# Patient Record
Sex: Female | Born: 2000 | Hispanic: Yes | Marital: Single | Smoking: Never smoker
Health system: Southern US, Community
[De-identification: ages and names within clinical notes are randomized; demographics above are authoritative.]

## PROBLEM LIST (undated history)

## (undated) DIAGNOSIS — Z789 Other specified health status: Secondary | ICD-10-CM

## (undated) HISTORY — PX: NO PAST SURGERIES: SHX2092

---
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
## 2019-03-11 ENCOUNTER — Emergency Department: Payer: Self-pay

## 2019-03-11 ENCOUNTER — Other Ambulatory Visit: Payer: Self-pay

## 2019-03-11 ENCOUNTER — Emergency Department
Admission: EM | Admit: 2019-03-11 | Discharge: 2019-03-11 | Disposition: A | Discharge status: Home / Self Care | Payer: Self-pay | Attending: Emergency Medicine | Admitting: Emergency Medicine

## 2019-03-11 DIAGNOSIS — S8262XA Displaced fracture of lateral malleolus of left fibula, initial encounter for closed fracture: Secondary | ICD-10-CM | POA: Insufficient documentation

## 2019-03-11 DIAGNOSIS — Y9351 Activity, roller skating (inline) and skateboarding: Secondary | ICD-10-CM | POA: Insufficient documentation

## 2019-03-11 DIAGNOSIS — Y929 Unspecified place or not applicable: Secondary | ICD-10-CM | POA: Insufficient documentation

## 2019-03-11 DIAGNOSIS — X501XXA Overexertion from prolonged static or awkward postures, initial encounter: Secondary | ICD-10-CM | POA: Insufficient documentation

## 2019-03-11 DIAGNOSIS — Y998 Other external cause status: Secondary | ICD-10-CM | POA: Insufficient documentation

## 2019-03-11 MED ORDER — HYDROCODONE-ACETAMINOPHEN 5-325 MG PO TABS
1.0000 | ORAL_TABLET | Freq: Once | ORAL | Status: AC
  Administered 2019-03-11: 1 via ORAL
  Filled 2019-03-11: qty 1

## 2019-03-11 MED ORDER — HYDROCODONE-ACETAMINOPHEN 5-325 MG PO TABS: 1.0000 | ORAL_TABLET | Freq: Three times a day (TID) | ORAL | 0 refills | Status: DC | PRN

## 2019-03-11 NOTE — ED Notes (Signed)
Requested splint application- techs en route to apply.

## 2019-03-11 NOTE — ED Notes (Signed)
Pt instructed on use of crutches and demonstration provided- pt verbalizes understanding returns demonstration correctly. CMS intact in lower extremities, pedal pulses 2+ bilaterally.

## 2019-03-11 NOTE — ED Provider Notes (Signed)
[facility] Emergency Department Provider Note ____________________________________________  Time seen: 1735  I have reviewed the triage vital signs and the nursing notes.  HISTORY  Chief Complaint  Ankle Pain   HPI Diane Turner is a 19 y.o. female presents to the ER today with complaint of left ankle pain and swelling.  She describes the pain as tight and heavy.  She reports some associated numbness and tingling.  She reports she was rollerskating 3 days ago when she fell.  She feels like she rolled her left ankle.  The swelling started immediately.  She has not noticed any bruising.  She reports increased pain with ambulation.  She denies knee, hip or back pain.  She has tried ice and anti-inflammatories OTC with minimal relief.  History reviewed. No pertinent past medical history.  There are no problems to display for this patient.   History reviewed. No pertinent surgical history.  Prior to Admission medications   Medication Sig Start Date End Date Taking? Authorizing Provider  HYDROcodone-acetaminophen (NORCO/VICODIN) 5-325 MG tablet Take 1 tablet by mouth every 8 (eight) hours as needed for moderate pain. 03/11/19 03/10/20  Lorre Munroe, NP    Allergies Patient has no known allergies.  History reviewed. No pertinent family history.  Social History Social History   Tobacco Use  . Smoking status: Never Smoker  Substance Use Topics  . Alcohol use: Never  . Drug use: Not on file    Review of Systems  Constitutional: Negative for fever, chills or body aches. Cardiovascular: Negative for chest pain or chest tightness. Respiratory: Negative for cough or shortness of breath. Musculoskeletal: Positive for right ankle pain, swelling and difficulty with gait. Skin: Negative for redness or warmth of the right ankle. Neurological: Positive for numbness and tingling of the right foot.   ____________________________________________  PHYSICAL  EXAM:  VITAL SIGNS: ED Triage Vitals  Enc Vitals Group     BP 03/11/19 1630 130/76     Pulse Rate 03/11/19 1630 (!) 101     Resp 03/11/19 1630 16     Temp 03/11/19 1630 98.6 F (37 C)     Temp Source 03/11/19 1630 Oral     SpO2 03/11/19 1630 100 %     Weight 03/11/19 1632 150 lb (68 kg)     Height 03/11/19 1632 5\' 5"  (1.651 m)     Head Circumference --      Peak Flow --      Pain Score 03/11/19 1631 0     Pain Loc --      Pain Edu? --      Excl. in GC? --     Constitutional: Alert and oriented. Well appearing and in no distress. Cardiovascular: Normal rate, regular rhythm. Pedal pulses 2+ bilaterally. Respiratory: Normal respiratory effort. No wheezes/rales/rhonchi. Musculoskeletal: Normal plantar flexion. Decreased dorsiflexion and rotation secondary to pain and swelling. 1-2+ swelling of the left ankle. Pain with palpation over the medial and lateral malleolus. Strength 5/5 BLE. Able to stand and bear weight of LLE, but painful. Neurologic:  Sensation intact to LLE. Skin:  Skin is warm, dry and intact. No redness noted.  ____________________________________________   RADIOLOGY   Imaging Orders     DG Ankle Complete Left IMPRESSION:  Comminuted fracture of the lateral malleolus at the level of the  ankle joint (Weber type B) with widening of the medial ankle  mortise, suggestive of instability.    ____________________________________________  PROCEDURES  .Splint Application  Date/Time: 03/11/2019 6:41  PM Performed by: Lonia Farber, NT Authorized by: Jearld Fenton, NP   Consent:    Consent obtained:  Verbal   Consent given by:  Patient   Risks discussed:  Pain and numbness Pre-procedure details:    Sensation:  Numbness   Skin color:  Pink Procedure details:    Laterality:  Left   Location:  Ankle   Ankle:  L ankle   Strapping: no     Cast type:  Short leg   Splint type:  Short leg   Supplies used: cadillac. Post-procedure details:    Pain:   Unchanged   Sensation:  Normal   Patient tolerance of procedure:  Tolerated well, no immediate complications    ____________________________________________  INITIAL IMPRESSION / ASSESSMENT AND PLAN / ED COURSE  Lateral Malleolar Fracture, Left:  Xray left ankle Hydrocodone 5-325 mg PO x 1 Cadillac splint apply Non weight bearing- crutches given RX for Hydrocodone 5-325 mg TID prn- sedation caution given Follow up with ortho as an outpatient     I reviewed the patient's prescription history over the last 12 months in the multi-state controlled substances database(s) that includes [place], [place], [place], [place], [place], [place], [place], [place], [place], [place], [place], [place], [place], and [place].  Results were notable for no recent controlled substances. ____________________________________________  FINAL CLINICAL IMPRESSION(S) / ED DIAGNOSES  Final diagnoses:  Closed displaced fracture of lateral malleolus of left fibula, initial encounter      Jearld Fenton, NP 03/11/19 1842    Blake Divine, MD 03/11/19 2315

## 2019-03-11 NOTE — ED Triage Notes (Signed)
Pt was skating last night and injured L ankle. Swelling noted. A&O. Spanish interpreter used for triage.

## 2019-03-11 NOTE — Discharge Instructions (Addendum)
You were seen today for left ankle ankle fracture. You have been placed in a splint and provided with crutches. You should not bear weight on your left foot until you have seen the orthopedic doctor. I have given you a RX for Hydrocodone to take every 8 hours as needed for pain

## 2019-03-20 ENCOUNTER — Other Ambulatory Visit: Payer: Self-pay | Admitting: Podiatry

## 2019-03-20 ENCOUNTER — Other Ambulatory Visit: Payer: Self-pay

## 2019-03-20 ENCOUNTER — Encounter: Payer: Self-pay | Admitting: Podiatry

## 2019-03-21 ENCOUNTER — Other Ambulatory Visit
Admission: RE | Admit: 2019-03-21 | Discharge: 2019-03-21 | Disposition: A | Discharge status: Home / Self Care | Payer: HRSA Program | Source: Ambulatory Visit | Attending: Podiatry | Admitting: Podiatry

## 2019-03-21 DIAGNOSIS — Z20822 Contact with and (suspected) exposure to covid-19: Secondary | ICD-10-CM | POA: Diagnosis not present

## 2019-03-21 DIAGNOSIS — Z01812 Encounter for preprocedural laboratory examination: Secondary | ICD-10-CM | POA: Insufficient documentation

## 2019-03-21 LAB — SARS CORONAVIRUS 2 (TAT 6-24 HRS): SARS Coronavirus 2: NEGATIVE

## 2019-03-23 ENCOUNTER — Ambulatory Visit
Admission: RE | Admit: 2019-03-23 | Discharge: 2019-03-23 | Disposition: A | Discharge status: Home / Self Care | Payer: Medicaid Other | Attending: Podiatry | Admitting: Podiatry

## 2019-03-23 ENCOUNTER — Encounter: Payer: Self-pay | Admitting: Podiatry

## 2019-03-23 ENCOUNTER — Encounter
Admission: RE | Disposition: A | Discharge status: Home / Self Care | Payer: Self-pay | Source: Home / Self Care | Attending: Podiatry

## 2019-03-23 ENCOUNTER — Ambulatory Visit: Payer: Medicaid Other | Attending: Podiatry

## 2019-03-23 ENCOUNTER — Other Ambulatory Visit: Payer: Self-pay

## 2019-03-23 ENCOUNTER — Ambulatory Visit: Payer: Self-pay

## 2019-03-23 ENCOUNTER — Ambulatory Visit: Payer: Medicaid Other | Admitting: Anesthesiology

## 2019-03-23 DIAGNOSIS — X501XXA Overexertion from prolonged static or awkward postures, initial encounter: Secondary | ICD-10-CM | POA: Diagnosis not present

## 2019-03-23 DIAGNOSIS — S93492A Sprain of other ligament of left ankle, initial encounter: Secondary | ICD-10-CM | POA: Insufficient documentation

## 2019-03-23 DIAGNOSIS — Y9351 Activity, roller skating (inline) and skateboarding: Secondary | ICD-10-CM | POA: Diagnosis not present

## 2019-03-23 DIAGNOSIS — S82842A Displaced bimalleolar fracture of left lower leg, initial encounter for closed fracture: Secondary | ICD-10-CM | POA: Diagnosis present

## 2019-03-23 DIAGNOSIS — Z01818 Encounter for other preprocedural examination: Secondary | ICD-10-CM | POA: Diagnosis present

## 2019-03-23 DIAGNOSIS — Z419 Encounter for procedure for purposes other than remedying health state, unspecified: Secondary | ICD-10-CM

## 2019-03-23 HISTORY — DX: Other specified health status: Z78.9

## 2019-03-23 HISTORY — PX: ORIF ANKLE FRACTURE: SHX5408

## 2019-03-23 LAB — POCT PREGNANCY, URINE: Preg Test, Ur: NEGATIVE

## 2019-03-23 SURGERY — OPEN REDUCTION INTERNAL FIXATION (ORIF) ANKLE FRACTURE
Anesthesia: Regional | Site: Ankle | Laterality: Left

## 2019-03-23 MED ORDER — MIDAZOLAM HCL 5 MG/5ML IJ SOLN
INTRAMUSCULAR | Status: DC | PRN
  Administered 2019-03-23: 2 mg via INTRAVENOUS

## 2019-03-23 MED ORDER — FENTANYL CITRATE (PF) 100 MCG/2ML IJ SOLN
25.0000 ug | INTRAMUSCULAR | Status: DC | PRN
  Administered 2019-03-23 (×2): 25 ug via INTRAVENOUS

## 2019-03-23 MED ORDER — OXYCODONE HCL 5 MG PO TABS
5.0000 mg | ORAL_TABLET | Freq: Once | ORAL | Status: AC | PRN
  Administered 2019-03-23: 5 mg via ORAL

## 2019-03-23 MED ORDER — DEXAMETHASONE SODIUM PHOSPHATE 4 MG/ML IJ SOLN
INTRAMUSCULAR | Status: DC | PRN
  Administered 2019-03-23: 4 mg via INTRAVENOUS

## 2019-03-23 MED ORDER — CEFAZOLIN SODIUM-DEXTROSE 2-4 GM/100ML-% IV SOLN
2.0000 g | INTRAVENOUS | Status: AC
  Administered 2019-03-23: 2 g via INTRAVENOUS

## 2019-03-23 MED ORDER — OXYCODONE-ACETAMINOPHEN 7.5-325 MG PO TABS: 1.0000 | ORAL_TABLET | Freq: Four times a day (QID) | ORAL | 0 refills | Status: AC | PRN

## 2019-03-23 MED ORDER — DEXMEDETOMIDINE HCL 200 MCG/2ML IV SOLN
INTRAVENOUS | Status: DC | PRN
  Administered 2019-03-23 (×2): 5 ug via INTRAVENOUS
  Administered 2019-03-23: 10 ug via INTRAVENOUS

## 2019-03-23 MED ORDER — OXYCODONE HCL 5 MG/5ML PO SOLN: 5.0000 mg | Freq: Once | ORAL | Status: AC | PRN

## 2019-03-23 MED ORDER — ONDANSETRON HCL 4 MG PO TABS: 4.0000 mg | ORAL_TABLET | Freq: Three times a day (TID) | ORAL | 0 refills | Status: AC | PRN

## 2019-03-23 MED ORDER — PROPOFOL 10 MG/ML IV BOLUS
INTRAVENOUS | Status: DC | PRN
  Administered 2019-03-23: 200 mg via INTRAVENOUS
  Administered 2019-03-23: 40 mg via INTRAVENOUS
  Administered 2019-03-23: 20 mg via INTRAVENOUS

## 2019-03-23 MED ORDER — POVIDONE-IODINE 7.5 % EX SOLN: Freq: Once | CUTANEOUS | Status: DC

## 2019-03-23 MED ORDER — ONDANSETRON HCL 4 MG/2ML IJ SOLN
INTRAMUSCULAR | Status: DC | PRN
  Administered 2019-03-23: 4 mg via INTRAVENOUS

## 2019-03-23 MED ORDER — LACTATED RINGERS IV SOLN
100.0000 mL/h | INTRAVENOUS | Status: DC
  Administered 2019-03-23: 100 mL/h via INTRAVENOUS

## 2019-03-23 MED ORDER — ASPIRIN EC 325 MG PO TBEC: 325.0000 mg | DELAYED_RELEASE_TABLET | Freq: Every day | ORAL | 0 refills | Status: AC

## 2019-03-23 MED ORDER — GLYCOPYRROLATE 0.2 MG/ML IJ SOLN
INTRAMUSCULAR | Status: DC | PRN
  Administered 2019-03-23: .2 mg via INTRAVENOUS

## 2019-03-23 MED ORDER — ROPIVACAINE HCL 5 MG/ML IJ SOLN
INTRAMUSCULAR | Status: DC | PRN
  Administered 2019-03-23: 200 mg via EPIDURAL

## 2019-03-23 MED ORDER — PHENYLEPHRINE HCL (PRESSORS) 10 MG/ML IV SOLN
INTRAVENOUS | Status: DC | PRN
  Administered 2019-03-23: 100 ug via INTRAVENOUS

## 2019-03-23 MED ORDER — FENTANYL CITRATE (PF) 100 MCG/2ML IJ SOLN
INTRAMUSCULAR | Status: DC | PRN
  Administered 2019-03-23: 50 ug via INTRAVENOUS

## 2019-03-23 MED ORDER — LIDOCAINE HCL (CARDIAC) PF 100 MG/5ML IV SOSY
PREFILLED_SYRINGE | INTRAVENOUS | Status: DC | PRN
  Administered 2019-03-23: 40 mg via INTRATRACHEAL

## 2019-03-23 MED ORDER — CEPHALEXIN 500 MG PO CAPS: 500.0000 mg | ORAL_CAPSULE | Freq: Four times a day (QID) | ORAL | 0 refills | Status: AC

## 2019-03-23 SURGICAL SUPPLY — 57 items
BAG SNAP BAND KOVER 36X36 (MISCELLANEOUS) ×2 IMPLANT
BIT DRILL 2.4X140 LONG SOLID (BIT) ×2 IMPLANT
BIT DRILL CANNULTD 2.6 X 130MM (DRILL) IMPLANT
BIT DRILL SOLID 2.0 X 110MM (DRILL) IMPLANT
BLADE SURG 15 STRL LF DISP TIS (BLADE) IMPLANT
BLADE SURG 15 STRL SS (BLADE) ×4
BNDG COHESIVE 4X5 TAN STRL (GAUZE/BANDAGES/DRESSINGS) ×3 IMPLANT
BNDG ELASTIC 4X5.8 VLCR NS LF (GAUZE/BANDAGES/DRESSINGS) ×3 IMPLANT
BNDG ELASTIC 6X5.8 VLCR NS LF (GAUZE/BANDAGES/DRESSINGS) ×3 IMPLANT
BNDG ESMARK 4X12 TAN STRL LF (GAUZE/BANDAGES/DRESSINGS) ×3 IMPLANT
BNDG GAUZE 4.5X4.1 6PLY STRL (MISCELLANEOUS) ×7 IMPLANT
CANISTER SUCT 1200ML W/VALVE (MISCELLANEOUS) ×3 IMPLANT
COUNTERSICK 4.0 HEADED (MISCELLANEOUS) ×3
COVER LIGHT HANDLE UNIVERSAL (MISCELLANEOUS) ×4 IMPLANT
DRAPE C-ARMOR (DRAPES) ×2 IMPLANT
DRILL CANNULATED 2.6 X 130MM (DRILL) ×3
DRILL SOLID 2.0 X 110MM (DRILL) ×3
DURAPREP 26ML APPLICATOR (WOUND CARE) ×5 IMPLANT
ELECT REM PT RETURN 9FT ADLT (ELECTROSURGICAL) ×3
ELECTRODE REM PT RTRN 9FT ADLT (ELECTROSURGICAL) ×1 IMPLANT
GAUZE SPONGE 4X4 12PLY STRL (GAUZE/BANDAGES/DRESSINGS) ×3 IMPLANT
GAUZE XEROFORM 1X8 LF (GAUZE/BANDAGES/DRESSINGS) ×3 IMPLANT
GLOVE BIO SURGEON STRL SZ7 (GLOVE) ×9 IMPLANT
GLOVE BIOGEL PI IND STRL 7.0 (GLOVE) ×1 IMPLANT
GLOVE BIOGEL PI INDICATOR 7.0 (GLOVE) ×6
GOWN STRL REUS W/ TWL LRG LVL3 (GOWN DISPOSABLE) ×2 IMPLANT
GOWN STRL REUS W/TWL LRG LVL3 (GOWN DISPOSABLE) ×6
K-WIRE SNGL END 1.2X150 (MISCELLANEOUS) ×6
KIT TURNOVER KIT A (KITS) ×3 IMPLANT
KWIRE SNGL END 1.2X150 (MISCELLANEOUS) IMPLANT
NS IRRIG 500ML POUR BTL (IV SOLUTION) ×3 IMPLANT
PACK EXTREMITY ARMC (MISCELLANEOUS) ×3 IMPLANT
PADDING CAST BLEND 4X4 NS (MISCELLANEOUS) ×11 IMPLANT
PENCIL SMOKE EVACUATOR (MISCELLANEOUS) ×3 IMPLANT
PLATE FIB CL 9H LT (Plate) ×2 IMPLANT
REPAIR TROPE KNTLS SYNDESMOSIS (Orthopedic Implant) ×2 IMPLANT
SCREW 3.5X22 (Screw) ×2 IMPLANT
SCREW CANN 4.0X46ST (Screw) ×2 IMPLANT
SCREW COUNTERSINK 4.0 HEADED (MISCELLANEOUS) IMPLANT
SCREW LOCK PLATE R3 2.7X10 (Screw) ×6 IMPLANT
SCREW LOCK PLATE R3 2.7X11 (Screw) ×4 IMPLANT
SCREW LOCK PLATE R3 2.7X9 (Screw) ×4 IMPLANT
SCREW LOCK PLT 9X2.7X R3CON (Screw) IMPLANT
SPLINT CAST 1 STEP 4X30 (MISCELLANEOUS) ×3 IMPLANT
STOCKINETTE IMPERVIOUS LG (DRAPES) ×3 IMPLANT
SUT ETHILON 4-0 (SUTURE) ×6
SUT ETHILON 4-0 FS2 18XMFL BLK (SUTURE) ×3
SUT MNCRL+ 5-0 UNDYED PC-3 (SUTURE) IMPLANT
SUT MONOCRYL 5-0 (SUTURE)
SUT VIC AB 0 CT1 27 (SUTURE)
SUT VIC AB 0 CT1 27XCR 8 STRN (SUTURE) ×1 IMPLANT
SUT VIC AB 2-0 SH 27 (SUTURE) ×2
SUT VIC AB 2-0 SH 27XBRD (SUTURE) ×1 IMPLANT
SUT VIC AB 3-0 SH 27 (SUTURE) ×4
SUT VIC AB 3-0 SH 27X BRD (SUTURE) ×1 IMPLANT
SUTURE ETHLN 4-0 FS2 18XMF BLK (SUTURE) IMPLANT
WIRE OLIVE SMOOTH 1.4MMX60MM (WIRE) ×4 IMPLANT

## 2019-03-23 NOTE — Op Note (Addendum)
PODIATRY / FOOT AND ANKLE SURGERY OPERATIVE REPORT    SURGEON: Caroline More, DPM  ASSISTANT: Albertine Patricia, DPM  PRE-OPERATIVE DIAGNOSIS:  1.  Left ankle bimalleolar displaced fracture involving fibular fracture and posterior malleolar fracture 2.  Left ankle syndesmotic disruption/injury  POST-OPERATIVE DIAGNOSIS: Same  PROCEDURE(S): 1. Left ankle bimalleolar fracture open reduction with internal fixation 2. Left ankle repair of syndesmotic ligament with usage of tight rope  HEMOSTASIS: Left thigh tourniquet  ANESTHESIA: general, preop popliteal and saphenous nerve block  ESTIMATED BLOOD LOSS: 20 cc  FINDING(S): 1.  Left fibula comminuted displaced distal fracture with syndesmotic involvement 2.  Left posterior tibial fracture at the posterior malleolus with minimal displacement 3.  Left ankle syndesmotic injury with increased medial clear space  PATHOLOGY/SPECIMEN(S): None  INDICATIONS:   Diane Turner is a 19 y.o. female who presents with an injury to the left ankle.  Patient states that over the weekend she was rollerblading and suffered an inversion type of ankle sprain.  Patient states that she was unable to place weight on the ankle after that time due to the injury.  Patient was taken to [facility] due to the left ankle injury where x-rays were taken showing a displaced bimalleolar fracture with increased medial clear space and decreased tibiofibular overlap indicating syndesmotic disruption.  Patient was placed in a posterior splint and given pain medication and instructed to remain nonweightbearing at all times to left lower extremity until follow-up.  Patient presented to the clinic 1 to 2 days after the initial injury for further work-up and evaluation.  Discussed all treatment options with the patient and patient's family both conservative and surgical attempts at correction include potential risks and complications of surgical intervention.  At  this time the patient and patient's family have elected for left ankle bimalleolar open reduction with internal fixation with syndesmotic ligament repair.  All questions were answered preoperatively including postoperative course as well as risks and benefits of the procedure.  Patient understands and would like to proceed at this time.  Patient presents today for surgical correction.  DESCRIPTION: After obtaining full informed written consent, the patient was brought back to the operating room and placed supine upon the operating table.  A preoperative saphenous and popliteal nerve block was performed by anesthesia.  The patient received IV antibiotics prior to induction.  A left thigh tourniquet was placed.  After obtaining adequate anesthesia, the patient was prepped and draped in the standard fashion.  An Esmarch bandage was used to exsanguinate the left lower extremity and the pneumatic thigh tourniquet was inflated.  Attention was then directed to the left ankle at the level of the distal fibula where under fluoroscopic guidance the margins of the fibular were marked and the fractured area which appeared to be a long oblique type of fracture was also marked under fluoroscopic guidance.  A midline incision was made over the distal fibula and lateral malleolus over the area of the fracture site.  Dissection was continued down to the subcutaneous tissues utilizing sharp and blunt dissection and care was taken to identify and retract all vital neural and vascular structures and all venous contributories were cauterized necessary.  The superficial peroneal nerve was identified and retracted anteriorly and laterally throughout the remainder the case.  At this time a periosteal type of incision was made into the lateral aspect of the fibula and lateral malleolus.  The periosteum was reflected anteriorly and posteriorly thereby exposing the distal fibula at the  operative site and the fracture at the operative  site.  The fracture appeared to be comminuted as there appeared to be a posterior spike present that is in a few pieces.  The fracture also appeared to be shortened considerably.  A curette was used to remove any debris from within the fracture site.  The fracture appeared to be mobile at this time with manual manipulation.  Utilizing manual manipulation the fracture fragment was reduced in the appropriate position with excellent alignment.  A bone clamp was used to hold the manipulation intact.  C-arm imaging was used to verify reduction which appeared to be excellent.  The fibula appeared to be out to length and the bone appeared to be well reduced.  The surgical site was flushed with copious amounts normal sterile saline.  At this time utilizing standard AO principles and techniques a 3.5 mm solid core Paragon 28 screw was placed across the anterior aspect of the fibula directed perpendicularly across the fracture site and into the distal fragment of the fibula again while holding compression and the reduction intact.  Excellent bite was able to be obtained.  The reduction clamp was removed and the fracture appeared to be very stable at this time.  C-arm imaging again was utilized to verify correct position and placement of screw which appeared to be excellent overall.  At this time the Paragon 28 distal fibular anatomic locking plate was placed at the lateral aspect of the fibula and temporarily fixated with olive wires.  C-arm imaging was used to verify correct position of plate which appeared to be excellent and directly on the bone.  Utilizing standard AO principles and techniques distal and proximal screws were then filled with 2.7 mm locking screws x9 of variable lengths based on depth.  This was also done under fluoroscopic guidance to ensure proper length of screw sizes.  The plate appeared to be fitting very stably and under fluoroscopic guidance was again on the fibula and excellent placement.    One  of the more proximal screw holes that was slightly more proximal to the area of the lag screw was left empty for the purpose of placing a syndesmotic tight rope.  Manual manipulation was performed under fluoroscopic guidance for reduction of the syndesmosis and a syndesmotic clamp was also applied to reduce the syndesmosis further, a small stab incision was made medially for placement of the syndesmotic clamp along the medial surface of the tibia.  Then utilizing standard AO principles and techniques the drill for the syndesmotic tight rope was used through the Paragon 28 drill hole that was described above.  This was placed such that it was parallel to the ankle joint and directed around 30 degrees anteriorly.  The Arthrex tight rope was then prepared and assembled and then a [place] needle was passed through the drill hole from lateral to medial.  The suture button was then flipped at the medial surface of the tibia such that the button laid across the medial aspect of the tibia and this was again performed under fluoroscopic guidance to visualize.  The button laterally was then snugged very close to the plate and was within the drill hole that was made.  The suture button was then compressed across the drill hole within the plate and excellent compression was noted across the syndesmosis.  The medial clear space appeared to be well reduced at this time and the tib-fib overlap appeared to be improved overall after placement to within normal limits.  Ankle joint was then brought through range of motion and noted to have excellent motion overall.  The posterior malleolar fragment appeared to be well reduced at this time but still appeared to be mildly gapped but the articular surface appeared to be intact and well aligned overall.  At this time a guidewire for a 4.0 cannulated Paragon 28 screw was placed at the anterior medial aspect of the distal tibia and under fluoroscopic guidance it was placed through the  posterior malleolar fragment.  A small stab incision was made around the pin and blunt dissection was continued down to the level of the surface of the bone.  Then using standard AO principles techniques a 4.0 x 46 mm Paragon 28 partially-threaded cannulated screw was placed across the posterior malleolar fracture site with excellent compression noted.  The purchase of the screw appeared to be adequate and the fracture appeared to be more compressed.  The guidewire was removed and passed off the operative site.  All surgical sites were flushed with copious amounts normal sterile saline.  The capsular/periosteal tissue at the lateral fibula incision site was reapproximated well coapted with 2-0 Vicryl.  The subcutaneous tissue was reapproximated well coapted at the lateral fibular site with 3-0 Vicryl.  All incision sites were reapproximated well coapted with 4-0 nylon and a combination of simple and horizontal mattress type stitching.  A postoperative dressing was applied consisting of Xeroform to the incision sites followed by 4 x 4 gauze, Kerlix, web roll, Ace wrap, web roll, posterior splint, Ace wrap.  The pneumatic thigh tourniquet was deflated and a prompt hyperemic response was noted to all digits of the left foot.  The patient tolerated the procedure and anesthesia well was transferred to the recovery room vital signs stable and vascular status intact to all toes left foot.  Following appear to postoperative monitoring the patient be discharged home with the following written oral postop instructions: Keep surgical dressings clean, dry, and intact until postoperative visit, ice and elevate left lower extremity when at rest, remain nonweightbearing to left lower extremity all times, take postoperative pain medication, antibiotics, antinausea medicine, and aspirin medication as prescribed, follow-up in 1 week after surgical date for further evaluation and treatment.  COMPLICATIONS: None  CONDITION:  Good, stable  Rosetta Posner, DPM

## 2019-03-23 NOTE — Anesthesia Procedure Notes (Signed)
Anesthesia Regional Block: Adductor canal block   Pre-Anesthetic Checklist: ,, timeout performed, Correct Patient, Correct Site, Correct Laterality, Correct Procedure, Correct Position, site marked, Risks and benefits discussed,  Surgical consent,  Pre-op evaluation,  At surgeon's request and post-op pain management  Laterality: Left  Prep: chloraprep       Needles:  Injection technique: Single-shot  Needle Type: Echogenic Needle     Needle Length: 9cm  Needle Gauge: 21     Additional Needles:   Procedures:,,,, ultrasound used (permanent image in chart),,,,  Narrative:  Start time: 03/23/2019 7:05 AM End time: 03/23/2019 7:15 AM Injection made incrementally with aspirations every 5 mL.  Performed by: Personally  Anesthesiologist: Jolayne Panther, MD  Additional Notes: Functioning IV was confirmed and monitors applied. Ultrasound guidance: relevant anatomy identified, needle position confirmed, local anesthetic spread visualized around nerve(s)., vascular puncture avoided.  Image printed for medical record.  Negative aspiration and no paresthesias; incremental administration of local anesthetic. The patient tolerated the procedure well. Vitals signes recorded in RN notes.  Total 15 mL 0.5% Ropivacaine injected in ACB.

## 2019-03-23 NOTE — Anesthesia Procedure Notes (Signed)
Procedure Name: LMA Insertion Date/Time: 03/23/2019 7:32 AM Performed by: Michaele Offer, CRNA Pre-anesthesia Checklist: Patient identified, Emergency Drugs available, Suction available, Patient being monitored and Timeout performed Patient Re-evaluated:Patient Re-evaluated prior to induction Oxygen Delivery Method: Circle system utilized Preoxygenation: Pre-oxygenation with 100% oxygen Induction Type: IV induction Ventilation: Mask ventilation with difficulty LMA: LMA inserted LMA Size: 4.0 Number of attempts: 1 Placement Confirmation: positive ETCO2 and breath sounds checked- equal and bilateral Tube secured with: Tape Dental Injury: Teeth and Oropharynx as per pre-operative assessment

## 2019-03-23 NOTE — Addendum Note (Signed)
Addendum  created 03/23/19 1343 by Jolayne Panther, MD   Child order released for a procedure order, Clinical Note Signed, Intraprocedure Blocks edited, Order Canceled from Note

## 2019-03-23 NOTE — Anesthesia Preprocedure Evaluation (Signed)
Anesthesia Evaluation  Patient identified by MRN, date of birth, ID band Patient awake    Airway Mallampati: I  TM Distance: >3 FB Neck ROM: full    Dental no notable dental hx.    Pulmonary neg pulmonary ROS,    Pulmonary exam normal breath sounds clear to auscultation       Cardiovascular negative cardio ROS Normal cardiovascular exam Rhythm:regular Rate:Normal     Neuro/Psych negative neurological ROS     GI/Hepatic negative GI ROS, Neg liver ROS,   Endo/Other  negative endocrine ROS  Renal/GU negative Renal ROS  negative genitourinary   Musculoskeletal   Abdominal   Peds  Hematology negative hematology ROS (+)   Anesthesia Other Findings   Reproductive/Obstetrics                             Anesthesia Physical Anesthesia Plan  ASA: I  Anesthesia Plan: General LMA and Regional   Post-op Pain Management: GA combined w/ Regional for post-op pain   Induction:   PONV Risk Score and Plan:   Airway Management Planned:   Additional Equipment:   Intra-op Plan:   Post-operative Plan:   Informed Consent: I have reviewed the patients History and Physical, chart, labs and discussed the procedure including the risks, benefits and alternatives for the proposed anesthesia with the patient or authorized representative who has indicated his/her understanding and acceptance.       Plan Discussed with:   Anesthesia Plan Comments:         Anesthesia Quick Evaluation

## 2019-03-23 NOTE — Anesthesia Postprocedure Evaluation (Signed)
Anesthesia Post Note  Patient: Diane Turner  Procedure(s) Performed: OPEN REDUCTION INTERNAL FIXATION (ORIF) ANKLE FRACTURE BIMALLEOLAR LEFT (Left Ankle)     Patient location during evaluation: PACU Anesthesia Type: Regional Level of consciousness: awake and alert Pain management: pain level controlled Vital Signs Assessment: post-procedure vital signs reviewed and stable Respiratory status: spontaneous breathing Cardiovascular status: stable Anesthetic complications: no    Verner Chol, III,  Joesphine Schemm D

## 2019-03-23 NOTE — Progress Notes (Signed)
Assisted Diane Turner ANMD with left, ultrasound guided, popliteal/saphenous block. Side rails up, monitors on throughout procedure. See vital signs in flow sheet. Tolerated Procedure well.  

## 2019-03-23 NOTE — Transfer of Care (Signed)
Immediate Anesthesia Transfer of Care Note  Patient: Diane Turner  Procedure(s) Performed: OPEN REDUCTION INTERNAL FIXATION (ORIF) ANKLE FRACTURE BIMALLEOLAR LEFT (Left Ankle)  Patient Location: PACU  Anesthesia Type: General LMA, Regional  Level of Consciousness: awake, alert  and patient cooperative  Airway and Oxygen Therapy: Patient Spontanous Breathing and Patient connected to supplemental oxygen  Post-op Assessment: Post-op Vital signs reviewed, Patient's Cardiovascular Status Stable, Respiratory Function Stable, Patent Airway and No signs of Nausea or vomiting  Post-op Vital Signs: Reviewed and stable  Complications: No apparent anesthesia complications

## 2019-03-23 NOTE — Anesthesia Procedure Notes (Signed)
Anesthesia Regional Block: Popliteal block   Pre-Anesthetic Checklist: ,, timeout performed, Correct Patient, Correct Site, Correct Laterality, Correct Procedure, Correct Position, site marked, Risks and benefits discussed,  Surgical consent,  Pre-op evaluation,  At surgeon's request and post-op pain management  Laterality: Left  Prep: chloraprep       Needles:  Injection technique: Single-shot  Needle Type: Echogenic Stimulator Needle      Needle Gauge: 21     Additional Needles:   Procedures:,,,, ultrasound used (permanent image in chart),,,,  Narrative:  Start time: 03/23/2019 7:05 AM End time: 03/23/2019 7:15 AM Injection made incrementally with aspirations every 5 mL.  Performed by: Personally  Anesthesiologist: Jolayne Panther, MD  Additional Notes: Functioning IV was confirmed and monitors applied.  Sterile prep and drape,hand hygiene and sterile gloves were used.Ultrasound guidance: relevant anatomy identified, needle position confirmed, local anesthetic spread visualized around nerve(s)., vascular puncture avoided.  Image printed for medical record.  Negative aspiration and negative test dose prior to incremental administration of local anesthetic. The patient tolerated the procedure well. Vitals signes recorded in RN notes.  Total 25 mL 0.5% Ropivacaine injected for popliteal nerve block.

## 2019-03-23 NOTE — H&P (Signed)
HISTORY AND PHYSICAL INTERVAL NOTE:  03/23/2019  7:17 AM  Diane Turner  has presented today for surgery, with the diagnosis of S 82.84  DISPLACED BIMALLEOLAR FRACTURE LOWER LEG LEFT.  The various methods of treatment have been discussed with the patient.  No guarantees were given.  After consideration of risks, benefits and other options for treatment, the patient has consented to surgery.  I have reviewed the patients' chart and labs.    PROCEDURE: ORIF LEFT ANKLE BIMALLEOLAR FRACTURE  A history and physical examination was performed in my office.  The patient was reexamined.  There have been no changes to this history and physical examination.  Rosetta Posner, DPM

## 2019-03-23 NOTE — Discharge Instructions (Signed)
[place] DR. TROXLER, DR. Vickki Muff, AND DR. [place]   1. Take your medication as prescribed.  Pain medication should be taken only as needed.  Take 1 pain pill every 6 hours as needed for your pain.  You may also take ibuprofen or Tylenol between doses.  The maximum dose of Tylenol or acetaminophen daily is 4000 mg.  The maximum dose of ibuprofen daily is 3200 mg.  If the pain still persists despite taking pain medication as well as ibuprofen or Tylenol and is very out-of-control and you may take 1 pain pill every 4 hours as needed.  Again try to take the pain medication as minimally as possible and only as needed.  2. Take postoperative antibiotics, antinausea medicine, and aspirin medication as prescribed.  3. Keep the dressing clean, dry and intact.  Remain nonweightbearing to the left lower extremity at all times.  4. Keep your foot elevated above the heart level for the first 48 hours.  5. Always use your crutches or knee scooter if you are to be non-weight bearing.  6. Do not take a shower. Baths are permissible as long as the foot/dressing/splint is kept out of the water.  If your dressing does get saturated please call clinic for instruction.  7. Every hour you are awake:  - Bend your knee 15 times. - Flex foot 15 times - Massage calf 15 times  8. Call [facility] [phone]) if any of the following problems occur: - You develop a temperature or fever. - The bandage becomes saturated with blood. - Medication does not stop your pain. - Injury of the foot occurs. - Any symptoms of infection including redness, odor, or red streaks running from wound.  Please followup in clinic in 1 week with Dr. Luana Shu for further care.  General Anesthesia, Adult, Care After This sheet gives you information about how to care for yourself after your procedure. Your health care  provider may also give you more specific instructions. If you have problems or questions, contact your health care provider. What can I expect after the procedure? After the procedure, the following side effects are common:  Pain or discomfort at the IV site.  Nausea.  Vomiting.  Sore throat.  Trouble concentrating.  Feeling cold or chills.  Weak or tired.  Sleepiness and fatigue.  Soreness and body aches. These side effects can affect parts of the body that were not involved in surgery. Follow these instructions at home:  For at least 24 hours after the procedure:  Have a responsible adult stay with you. It is important to have someone help care for you until you are awake and alert.  Rest as needed.  Do not: ? Participate in activities in which you could fall or become injured. ? Drive. ? Use heavy machinery. ? Drink alcohol. ? Take sleeping pills or medicines that cause drowsiness. ? Make important decisions or sign legal documents. ? Take care of children on your own. Eating and drinking  Follow any instructions from your health care provider about eating or drinking restrictions.  When you feel hungry, start by eating small amounts of foods that are soft and easy to digest (bland), such as toast. Gradually return to your regular diet.  Drink enough fluid to keep your urine pale yellow.  If you vomit, rehydrate by drinking water, juice, or clear broth. General instructions  If you have sleep apnea, surgery and  certain medicines can increase your risk for breathing problems. Follow instructions from your health care provider about wearing your sleep device: ? Anytime you are sleeping, including during daytime naps. ? While taking prescription pain medicines, sleeping medicines, or medicines that make you drowsy.  Return to your normal activities as told by your health care provider. Ask your health care provider what activities are safe for you.  Take  over-the-counter and prescription medicines only as told by your health care provider.  If you smoke, do not smoke without supervision.  Keep all follow-up visits as told by your health care provider. This is important. Contact a health care provider if:  You have nausea or vomiting that does not get better with medicine.  You cannot eat or drink without vomiting.  You have pain that does not get better with medicine.  You are unable to pass urine.  You develop a skin rash.  You have a fever.  You have redness around your IV site that gets worse. Get help right away if:  You have difficulty breathing.  You have chest pain.  You have blood in your urine or stool, or you vomit blood. Summary  After the procedure, it is common to have a sore throat or nausea. It is also common to feel tired.  Have a responsible adult stay with you for the first 24 hours after general anesthesia. It is important to have someone help care for you until you are awake and alert.  When you feel hungry, start by eating small amounts of foods that are soft and easy to digest (bland), such as toast. Gradually return to your regular diet.  Drink enough fluid to keep your urine pale yellow.  Return to your normal activities as told by your health care provider. Ask your health care provider what activities are safe for you. This information is not intended to replace advice given to you by your health care provider. Make sure you discuss any questions you have with your health care provider. Document Revised: 01/08/2017 Document Reviewed: 08/21/2016 Elsevier Patient Education  2020 [employer].

## 2019-03-24 ENCOUNTER — Encounter: Payer: Self-pay | Admitting: *Deleted

## 2019-04-14 ENCOUNTER — Ambulatory Visit: Payer: Self-pay | Attending: Internal Medicine

## 2019-04-14 DIAGNOSIS — Z23 Encounter for immunization: Secondary | ICD-10-CM

## 2019-04-14 NOTE — Progress Notes (Signed)
   Covid-19 Vaccination Clinic  Name:  Diane Turner    MRN: [id number] DOB: [birth year]  04/14/2019  Diane Turner was observed post Covid-19 immunization for 15 minutes without incident. She was provided with Vaccine Information Sheet and instruction to access the V-Safe system.   Diane Turner was instructed to call 911 with any severe reactions post vaccine: Marland Kitchen Difficulty breathing  . Swelling of face and throat  . A fast heartbeat  . A bad rash all over body  . Dizziness and weakness   Immunizations Administered    Name Date Dose VIS Date Route   Pfizer COVID-19 Vaccine 04/14/2019  3:22 PM 0.3 mL 12/30/2018 Intramuscular   Manufacturer: [employer], [employer]   Lot: [id number]   NDC: [address]-8

## 2019-05-05 ENCOUNTER — Ambulatory Visit: Payer: Self-pay | Attending: Internal Medicine

## 2019-05-05 DIAGNOSIS — Z23 Encounter for immunization: Secondary | ICD-10-CM

## 2019-05-05 NOTE — Progress Notes (Signed)
   Covid-19 Vaccination Clinic  Name:  Diane Turner    MRN: [id number] DOB: 2000-02-18  05/05/2019  [name] was observed post Covid-19 immunization for 15 minutes without incident. She was provided with Vaccine Information Sheet and instruction to access the V-Safe system.   [name] was instructed to call 911 with any severe reactions post vaccine: Marland Kitchen Difficulty breathing  . Swelling of face and throat  . A fast heartbeat  . A bad rash all over body  . Dizziness and weakness   Immunizations Administered    Name Date Dose VIS Date Route   Pfizer COVID-19 Vaccine 05/05/2019  2:35 PM 0.3 mL 12/30/2018 Intramuscular   Manufacturer: [employer], [employer]   Lot: [id number]   NDC: [address]-4

## 2019-05-23 ENCOUNTER — Ambulatory Visit: Payer: Medicaid Other | Attending: Podiatry | Admitting: Physical Therapy

## 2019-05-23 ENCOUNTER — Encounter: Payer: Self-pay | Admitting: Physical Therapy

## 2019-05-23 ENCOUNTER — Other Ambulatory Visit: Payer: Self-pay

## 2019-05-23 DIAGNOSIS — M25572 Pain in left ankle and joints of left foot: Secondary | ICD-10-CM | POA: Diagnosis present

## 2019-05-23 DIAGNOSIS — M25672 Stiffness of left ankle, not elsewhere classified: Secondary | ICD-10-CM | POA: Diagnosis present

## 2019-05-23 NOTE — Therapy (Addendum)
[facility] [address], [place], [address] Phone: [phone]   Fax:  [phone]  Physical Therapy Evaluation  Patient Details  Name: Diane Turner MRN: [id number] Date of Birth: [birth year] No data recorded  Encounter Date: 05/23/2019    Past Medical History:  Diagnosis Date  . Medical history non-contributory     Past Surgical History:  Procedure Laterality Date  . NO PAST SURGERIES    . ORIF ANKLE FRACTURE Left 03/23/2019   Procedure: OPEN REDUCTION INTERNAL FIXATION (ORIF) ANKLE FRACTURE BIMALLEOLAR LEFT;  Surgeon: Caroline More, DPM;  Location: [place];  Service: Podiatry;  Laterality: Left;  CHOICE WITH PREOP POP/SAPH BLOCK    There were no vitals filed for this visit.        OBJECTIVE  MUSCULOSKELETAL: Tremor: Absent Bulk: Normal Tone: Normal, no clonus No trophic changes noted to foot/ankle. No ecchymosis, erythema, or edema noted. No gross ankle/foot deformity noted  Lumbar/Hip/Knee Screen AROM: WFL and painless with overpressure in all planes  Posture Increased RLE WB  Gait 3 point with bilat crutches and CAM boot decreased RLE step and LLE stance time Stairs: step to leading with RLE ascent, LLE descent with need for crutch support d/t about 50% WB on descent; good crutch negotiation  Palpation TTP at lateral incision, no TTP at medial malleolus   Strength R/L 5/5 Hip flexion 5/5 Hip external rotation 5/5 Hip internal rotation 4/4 Hip extension  5/5 Hip abduction 5/5 Hip adduction 5/5 Knee extension 5/5 Knee flexion 5/4+ Ankle Plantarflexion 5/2+ Ankle Dorsiflexion 5/4+ Ankle Inversion 5/4+ Ankle Eversion SL heel raise R 20 L unable d/t restrictions No break test MMT performed; only make test  AROM R/L 50/30 Ankle Plantarflexion 18/-4 Ankle Dorsiflexion 50/33 Ankle Inversion 28/26 Ankle Eversion *Indicates Pain  PROM R/L 50/38 Ankle  Plantarflexion 20/0 Ankle Dorsiflexion 50/42 Ankle Inversion 28/28 Ankle Eversion *Indicates Pain  Passive Accessory Motion Superior Tibiofibular Joint: WNL Inferior Tibiofibular Joint: WNL Talocrural Joint AP: WNL Talocrural Joint PA: WNL All WNL on RLE guarded on LLE, but with good mobility without pain  NEUROLOGICAL:  Mental Status Patient is oriented to person, place and time.  Recent memory is intact.  Remote memory is intact.  Attention span and concentration are intact.  Expressive speech is intact.  Patient's fund of knowledge is within normal limits for educational level.  Sensation Grossly intact to light touch bilateral LEs as determined by testing dermatomes L2-S2 Proprioception and hot/cold testing deferred on this date  VASCULAR Dorsalis pedis and posterior tibial pulses are palpable   Ther-Ex Patient is able to demonstrate a set of the following with good carry over and understanding. Review of current HEP for ankle mobility and boot/WB precautions with understanding Access Code: [email or web address]:  Side to Side Weight Shift with Counter Support - 3 x daily - 7 x weekly - 20 reps - 3sec hold  Long Sitting Calf Stretch with Strap - 3 x daily - 7 x weekly - 30sec hold                     PT Short Term Goals - 06/16/19 1048      PT SHORT TERM GOAL #1   Title Pt will be independent with HEP in order to decrease ankle pain and increase strength in order to improve pain-free function at home and work.    Baseline 05/23/19    Time 4    Period Weeks  Status On-going      PT SHORT TERM GOAL #2   Title Patient will demonstrate full ankle PROM in order to demonstrate PLOF joint motion    Baseline 05/23/19 PF 38d DF 0d Inv 42d    Time 4    Period Days    Status On-going             PT Long Term Goals - 06/16/19 1048      PT LONG TERM GOAL #1   Title Patient will increase FOTO score to 72 to demonstrate predicted increase in functional mobility  to complete ADLs    Baseline 05/23/19 40    Time 8    Period Weeks    Status On-going      PT LONG TERM GOAL #2   Title Pt will decrease worst pain as reported on NPRS by at least 3 points in order to demonstrate clinically significant reduction in ankle/foot pain.    Baseline 05/23/19 5/10 with weight bearing    Time 8    Period Weeks    Status On-going      PT LONG TERM GOAL #3   Title Patient will demonstrate full L ankle AROM to demonstrate PLOF and motion needed for normalized gait and stair mechanics    Baseline 05/23/19 PF 30d DF -4d Inversion 33d Eversion: 26d    Time 8    Period Weeks    Status On-going      PT LONG TERM GOAL #4   Title Patient will demonstrate normalized gait mechanics without AD in order to return to PLOF    Baseline 05/23/19 3 point with bilat crutches and boot decreased RLE step and LLE stance time    Time 8    Period Weeks    Status On-going                  Patient will benefit from skilled therapeutic intervention in order to improve the following deficits and impairments:  Abnormal gait, Pain, Improper body mechanics, Increased fascial restricitons, Postural dysfunction, Impaired tone, Decreased scar mobility, Decreased coordination, Decreased activity tolerance, Decreased endurance, Decreased range of motion, Decreased strength, Decreased balance, Difficulty walking, Impaired flexibility  Visit Diagnosis: Pain in left ankle and joints of left foot - Plan: PT plan of care cert/re-cert  Stiffness of left ankle, not elsewhere classified - Plan: PT plan of care cert/re-cert     Problem List There are no problems to display for this patient.  Staci Acosta PT, DPT Hilda Lias 07/06/2019, 10:01 AM  [facility] [address], [place], [address] Phone: [phone]   Fax:  [phone]  Name: FRITZIE PRIOLEAU MRN: [id number] Date of Birth: 2001-01-19

## 2019-05-25 ENCOUNTER — Ambulatory Visit: Payer: Medicaid Other | Admitting: Physical Therapy

## 2019-05-25 ENCOUNTER — Other Ambulatory Visit: Payer: Self-pay

## 2019-05-25 ENCOUNTER — Encounter: Payer: Self-pay | Admitting: Physical Therapy

## 2019-05-25 DIAGNOSIS — M25572 Pain in left ankle and joints of left foot: Secondary | ICD-10-CM | POA: Diagnosis not present

## 2019-05-25 DIAGNOSIS — M25672 Stiffness of left ankle, not elsewhere classified: Secondary | ICD-10-CM

## 2019-05-25 NOTE — Therapy (Signed)
[facility] [address], [place], [address] Phone: [phone]   Fax:  [phone]  Physical Therapy Treatment  Patient Details  Name: Diane Turner MRN: [id number] Date of Birth: 10-07-2000 No data recorded  Encounter Date: 05/25/2019  PT End of Session - 05/25/19 1323    Visit Number  2    Number of Visits  17    Date for PT Re-Evaluation  07/18/19    PT Start Time  0110    PT Stop Time  0148    PT Time Calculation (min)  38 min    Activity Tolerance  Patient tolerated treatment well    Behavior During Therapy  [facility] for tasks assessed/performed       Past Medical History:  Diagnosis Date  . Medical history non-contributory     Past Surgical History:  Procedure Laterality Date  . NO PAST SURGERIES    . ORIF ANKLE FRACTURE Left 03/23/2019   Procedure: OPEN REDUCTION INTERNAL FIXATION (ORIF) ANKLE FRACTURE BIMALLEOLAR LEFT;  Surgeon: Caroline More, DPM;  Location: [place];  Service: Podiatry;  Laterality: Left;  CHOICE WITH PREOP POP/SAPH BLOCK    There were no vitals filed for this visit.  Subjective Assessment - 05/25/19 1313    Subjective  Reports she is walking with 1 crutch today. Reporgs only 4/10 pain today and decent compliance with HEP, and has felt better about WB with weight shifting.    Pertinent History  Pt is a 19 year old female presenting s/p bimallelor displaced ankle fracture, with surgical repair 03/23/19 with cast removal on 05/03/19.Patient is completing exercises at home per surgeon and is currently wearing a boot, except for exercising, showering, and sleeping. Patient reports she is having a follow up 05/31/19, has been instructed to wear boot until this appointment. Patient is currently ambulating with bilat crutches and boot, and what she estimates is 60% WB on LLE. Per notes in chart patient is able to 100% WB on LLE tomorrow, but patient does not seem aware of this.  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 Worst pain in past week: 5/10 best: 0/10. reports surgical pain with increasing wt through LLE. Pt denies N/V, B&B changes, unexplained weight fluctuation, saddle paresthesia, fever, night sweats, or unrelenting night pain at this time.    Limitations  Lifting;Standing;Walking;House hold activities    How long can you sit comfortably?  unlimited    How long can you stand comfortably?  64mins    How long can you walk comfortably?  78mins    Diagnostic tests  Xrays    Patient Stated Goals  Return to walk    Pain Onset  1 to 4 weeks ago         Ther-Ex Long sitting PF <> DF x15  Long sitting PF into GTB x12 BluTB x12 BlackTB x12 Long sitting DF w/ BlackTB 2x12; Eversion x12; Inversion x12; cuing for eccentric control with good carry over BAPS board L2 clockwise x15 and counterclockwise x15 circles with min cuing for full ROM BAPS L3 PF <> DF to float 2x 12 for increased control with good carry over Standing wt shifting x20 unable to wt shift to L with R heel raise, but able to increase wt bearing from eval  Gait Training Over 64ft  with unilateral crutch, demonstration needed for proper technique with patient able to compoly increasing step length with cuing to reciprocal pattern, with fatigue Stair training with unilateral crutch CGA for safety and demo and heavy cuing for technique without rails, good carry over                PT Education - 05/25/19 1323    Education Details  therex form; gait training    Person(s) Educated  Patient    Methods  Explanation;Demonstration;Verbal cues;Tactile cues    Comprehension  Verbalized understanding;Returned demonstration;Verbal cues required;Tactile cues required       PT Short Term Goals - 05/24/19 1505      PT SHORT TERM GOAL #1    Title  Pt will be independent with HEP in order to decrease ankle pain and increase strength in order to improve pain-free function at home and work.    Baseline  05/23/19    Time  4    Period  Weeks    Status  New      PT SHORT TERM GOAL #2   Title  Patient will demonstrate full ankle PROM in order to demonstrate PLOF joint motion    Baseline  05/23/19 PF 38d DF 0d Inv 42d    Time  4    Period  Weeks    Status  New        PT Long Term Goals - 05/24/19 1300      PT LONG TERM GOAL #1   Title  Patient will increase FOTO score to 72 to demonstrate predicted increase in functional mobility to complete ADLs    Baseline  05/23/19 40    Time  8    Period  Weeks    Status  New      PT LONG TERM GOAL #2   Title  Pt will decrease worst pain as reported on NPRS by at least 3 points in order to demonstrate clinically significant reduction in ankle/foot pain.    Baseline  05/23/19 5/10 with weight bearing    Time  8    Period  Weeks    Status  New      PT LONG TERM GOAL #3   Title  Patient will demonstrate full L ankle AROM to demonstrate PLOF and motion needed for normalized gait and stair mechanics    Baseline  05/23/19 PF 30d DF -4d Inversion 33d Eversion: 26d    Time  8    Period  Weeks    Status  New      PT LONG TERM GOAL #4   Title  Patient will demonstrate normalized gait mechanics without AD in order to return to PLOF    Baseline  05/23/19 3 point with bilat crutches and boot decreased RLE step and LLE stance time    Time  8    Period  Weeks    Status  New            Plan - 05/25/19 1332    Clinical Impression Statement  PT initiated therex progression for increased ankle mobility for increased WB in CAM boot with good success. Patient is able to comply with all cuing for proper technique of therex with good motivation. Patient is able to comply with all cuing with proper gait technique for ambulation and stair efficiency and safety. PT encouraged patient to continue HEP and 1  crutch use over the weekend with verbalized understanding. PT will continue progression as able.  Personal Factors and Comorbidities  Age;Behavior Pattern;Comorbidity 1;Fitness;Profession    Comorbidities  overweight    Examination-Activity Limitations  Squat;Lift;Stairs;Stand;Locomotion Level;Transfers    Examination-Participation Restrictions  Shop;Community Activity;Meal Prep;Yard Work    Conservation officer, historic buildings  Evolving/Moderate complexity    Clinical Decision Making  Moderate    Rehab Potential  Good    PT Frequency  2x / week    PT Duration  8 weeks    PT Treatment/Interventions  ADLs/Self Care Home Management;Electrical Stimulation;Therapeutic activities;Patient/family education;Joint Manipulations;Spinal Manipulations;Passive range of motion;Manual techniques;Dry needling;Functional mobility training;Cryotherapy;Ultrasound;Neuromuscular re-education;Balance training;Therapeutic exercise;Orthotic Fit/Training;DME Instruction;Iontophoresis 4mg /ml Dexamethasone;Aquatic Therapy;Moist Heat;Gait training;Stair training;Traction    PT Next Visit Plan  crutch wean    PT Home Exercise Plan  calf stretch, AROM DF/PF, wt shifting    Consulted and Agree with Plan of Care  Patient       Patient will benefit from skilled therapeutic intervention in order to improve the following deficits and impairments:  Abnormal gait, Pain, Improper body mechanics, Increased fascial restricitons, Postural dysfunction, Impaired tone, Decreased scar mobility, Decreased coordination, Decreased activity tolerance, Decreased endurance, Decreased range of motion, Decreased strength, Decreased balance, Difficulty walking, Impaired flexibility  Visit Diagnosis: Pain in left ankle and joints of left foot  Stiffness of left ankle, not elsewhere classified     Problem List There are no problems to display for this patient.  PT, DPT Staci Acosta 05/25/2019, 1:55 PM  [facility] [address], [place] Phone: [phone]   Fax:  [phone]  Name: Diane Turner MRN: [id number] Date of Birth: 07/21/2000

## 2019-05-29 ENCOUNTER — Ambulatory Visit: Payer: Medicaid Other | Admitting: Physical Therapy

## 2019-05-30 ENCOUNTER — Ambulatory Visit: Payer: Medicaid Other | Admitting: Physical Therapy

## 2019-05-30 ENCOUNTER — Other Ambulatory Visit: Payer: Self-pay

## 2019-05-30 ENCOUNTER — Encounter: Payer: Self-pay | Admitting: Physical Therapy

## 2019-05-30 DIAGNOSIS — M25572 Pain in left ankle and joints of left foot: Secondary | ICD-10-CM | POA: Diagnosis not present

## 2019-05-30 DIAGNOSIS — M25672 Stiffness of left ankle, not elsewhere classified: Secondary | ICD-10-CM

## 2019-05-30 NOTE — Therapy (Signed)
[place] [facility] [address] [place], [address] Phone: [phone]   Fax:  [phone]  Physical Therapy Treatment  Patient Details  Name: Diane Turner MRN: [id number] Date of Birth: 2000/08/12 No data recorded  Encounter Date: 05/30/2019  PT End of Session - 05/30/19 1617    Visit Number  3    Number of Visits  17    Date for PT Re-Evaluation  07/18/19    PT Start Time  0407    PT Stop Time  0445    PT Time Calculation (min)  38 min    Activity Tolerance  Patient tolerated treatment well    Behavior During Therapy  [facility] for tasks assessed/performed       Past Medical History:  Diagnosis Date  . Medical history non-contributory     Past Surgical History:  Procedure Laterality Date  . NO PAST SURGERIES    . ORIF ANKLE FRACTURE Left 03/23/2019   Procedure: OPEN REDUCTION INTERNAL FIXATION (ORIF) ANKLE FRACTURE BIMALLEOLAR LEFT;  Surgeon: Rosetta Posner, DPM;  Location: [facility];  Service: Podiatry;  Laterality: Left;  CHOICE WITH PREOP POP/SAPH BLOCK    There were no vitals filed for this visit.  Subjective Assessment - 05/30/19 1613    Subjective  Patient reports no pain today, has been walking with 1 crutch and thinks she is doing well, does have some pain with stepping onto LLE.    Pertinent History  Pt is a 19 year old female presenting s/p bimallelor displaced ankle fracture, with surgical repair 03/23/19 with cast removal on 05/03/19.Patient is completing exercises at home per surgeon and is currently wearing a boot, except for exercising, showering, and sleeping. Patient reports she is having a follow up 05/31/19, has been instructed to wear boot until this appointment. Patient is currently ambulating with bilat crutches and boot, and what she estimates is 60% WB on LLE. Per notes in chart patient is able to 100% WB on LLE tomorrow, but patient does not seem aware of this. 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 Worst pain in past week: 5/10 best: 0/10. reports surgical pain with increasing wt through LLE. Pt denies N/V, B&B changes, unexplained weight fluctuation, saddle paresthesia, fever, night sweats, or unrelenting night pain at this time.    Limitations  Lifting;Standing;Walking;House hold activities    How long can you sit comfortably?  unlimited    How long can you stand comfortably?     How long can you walk comfortably?     Diagnostic tests  Xrays    Patient Stated Goals  Return to walk    Pain Onset  1 to 4 weeks ago       Ther-Ex Long sitting PF <> DF x15  Long sitting PF/DF/Inv/Eversion BluTB 2x 12 with cuing for eccentric control with good carry over Seated BAPS board L3 clockwise x15 and counterclockwise x15 circles with min cuing for full ROM Seated BAPS L3 PF <> DF to float 2x 12 for increased control with good carry over Standing wt shifting x20 wt shift to L with R heel raise which is an improvement from previous session   Gait Training Over 6ft with unilateral crutch, demonstration needed for proper technique with patient able to demonstrate good carry over from  previous session, cuing needed for upright posture Ambulation over 25 ft without AD with good reciprocal pattern, decreased R step length CGA for safety                          PT Education - 05/30/19 1617    Education Details  therex form, gait training    Person(s) Educated  Patient    Methods  Explanation;Demonstration;Verbal cues    Comprehension  Verbalized understanding;Returned demonstration;Verbal cues required       PT Short Term Goals - 05/24/19 1505      PT SHORT TERM GOAL #1   Title  Pt will be independent with HEP in order to decrease ankle pain and increase strength in order to  improve pain-free function at home and work.    Baseline  05/23/19    Time  4    Period  Weeks    Status  New      PT SHORT TERM GOAL #2   Title  Patient will demonstrate full ankle PROM in order to demonstrate PLOF joint motion    Baseline  05/23/19 PF 38d DF 0d Inv 42d    Time  4    Period  Weeks    Status  New        PT Long Term Goals - 05/24/19 1300      PT LONG TERM GOAL #1   Title  Patient will increase FOTO score to 72 to demonstrate predicted increase in functional mobility to complete ADLs    Baseline  05/23/19 40    Time  8    Period  Weeks    Status  New      PT LONG TERM GOAL #2   Title  Pt will decrease worst pain as reported on NPRS by at least 3 points in order to demonstrate clinically significant reduction in ankle/foot pain.    Baseline  05/23/19 5/10 with weight bearing    Time  8    Period  Weeks    Status  New      PT LONG TERM GOAL #3   Title  Patient will demonstrate full L ankle AROM to demonstrate PLOF and motion needed for normalized gait and stair mechanics    Baseline  05/23/19 PF 30d DF -4d Inversion 33d Eversion: 26d    Time  8    Period  Weeks    Status  New      PT LONG TERM GOAL #4   Title  Patient will demonstrate normalized gait mechanics without AD in order to return to PLOF    Baseline  05/23/19 3 point with bilat crutches and boot decreased RLE step and LLE stance time    Time  8    Period  Weeks    Status  New            Plan - 05/30/19 1621    Clinical Impression Statement  PT continued progression for increased ankle mobility, strength, and proprioception with good success. Patient is able to comply with all cuing for proper technique of therex without compensation with good motivation and no increased pain. Patient is improving gait and wt shifting, though she is not quite able to obtain normalized gait pattern. PT will continue progression as able.    Personal Factors and Comorbidities  Age;Behavior Pattern;Comorbidity  1;Fitness;Profession    Comorbidities  overweight    Examination-Activity Limitations  Squat;Lift;Stairs;Stand;Locomotion Level;Transfers    Examination-Participation  Restrictions  Shop;Community Activity;Meal Prep;Yard Work    Conservation officer, historic buildings  Evolving/Moderate complexity    Clinical Decision Making  Moderate    Rehab Potential  Good    PT Frequency  2x / week    PT Duration  8 weeks    PT Treatment/Interventions  ADLs/Self Care Home Management;Electrical Stimulation;Therapeutic activities;Patient/family education;Joint Manipulations;Spinal Manipulations;Passive range of motion;Manual techniques;Dry needling;Functional mobility training;Cryotherapy;Ultrasound;Neuromuscular re-education;Balance training;Therapeutic exercise;Orthotic Fit/Training;DME Instruction;Iontophoresis 4mg /ml Dexamethasone;Aquatic Therapy;Moist Heat;Gait training;Stair training;Traction    PT Next Visit Plan  crutch wean    PT Home Exercise Plan  calf stretch, AROM DF/PF, wt shifting    Consulted and Agree with Plan of Care  Patient       Patient will benefit from skilled therapeutic intervention in order to improve the following deficits and impairments:  Abnormal gait, Pain, Improper body mechanics, Increased fascial restricitons, Postural dysfunction, Impaired tone, Decreased scar mobility, Decreased coordination, Decreased activity tolerance, Decreased endurance, Decreased range of motion, Decreased strength, Decreased balance, Difficulty walking, Impaired flexibility  Visit Diagnosis: Pain in left ankle and joints of left foot  Stiffness of left ankle, not elsewhere classified     Problem List There are no problems to display for this patient.  PT, DPT Staci Acosta 05/30/2019, 4:52 PM  [facility] PHYSICAL AND SPORTS MEDICINE [address], [place] Phone: [phone]   Fax:  [phone]  Name: GENEVER HENTGES MRN: Leta Baptist Date of Birth: Sep 11, 2000

## 2019-05-31 ENCOUNTER — Ambulatory Visit: Payer: Medicaid Other | Admitting: Physical Therapy

## 2019-06-02 ENCOUNTER — Ambulatory Visit: Payer: Medicaid Other | Admitting: Physical Therapy

## 2019-06-02 ENCOUNTER — Other Ambulatory Visit: Payer: Self-pay

## 2019-06-02 ENCOUNTER — Encounter: Payer: Self-pay | Admitting: Physical Therapy

## 2019-06-02 DIAGNOSIS — M25572 Pain in left ankle and joints of left foot: Secondary | ICD-10-CM | POA: Diagnosis not present

## 2019-06-02 DIAGNOSIS — M25672 Stiffness of left ankle, not elsewhere classified: Secondary | ICD-10-CM

## 2019-06-02 NOTE — Therapy (Signed)
[place] [facility] [address], [place], [address] Phone: [phone]   Fax:  [phone]  Physical Therapy Treatment  Patient Details  Name: Diane Turner MRN: [id number] Date of Birth: [birth year] No data recorded  Encounter Date: 06/02/2019  PT End of Session - 06/02/19 0847    Visit Number  4    Number of Visits  17    Date for PT Re-Evaluation  07/18/19    PT Start Time  0837    PT Stop Time  0915    PT Time Calculation (min)  38 min    Activity Tolerance  Patient tolerated treatment well    Behavior During Therapy  [facility] for tasks assessed/performed       Past Medical History:  Diagnosis Date  . Medical history non-contributory     Past Surgical History:  Procedure Laterality Date  . NO PAST SURGERIES    . ORIF ANKLE FRACTURE Left 03/23/2019   Procedure: OPEN REDUCTION INTERNAL FIXATION (ORIF) ANKLE FRACTURE BIMALLEOLAR LEFT;  Surgeon: Rosetta Posner, DPM;  Location: [facility];  Service: Podiatry;  Laterality: Left;  CHOICE WITH PREOP POP/SAPH BLOCK    There were no vitals filed for this visit.  Subjective Assessment - 06/02/19 0839    Subjective  Patinet reports good report from MD. Reports that she has been trying to walk without crutch in her house, and has been having decreased pain during the day, but a little more pain at night following increased activity. 1/10 pain this am    Pertinent History  Pt is a 19 year old female presenting s/p bimallelor displaced ankle fracture, with surgical repair 03/23/19 with cast removal on 05/03/19.Patient is completing exercises at home per surgeon and is currently wearing a boot, except for exercising, showering, and sleeping. Patient reports she is having a follow up 05/31/19, has been instructed to wear boot until this appointment. Patient is currently ambulating with bilat crutches and boot, and what she estimates is 60% WB on LLE. Per notes in chart  patient is able to 100% WB on LLE tomorrow, but patient does not seem aware of this. 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 Worst pain in past week: 5/10 best: 0/10. reports surgical pain with increasing wt through LLE. Pt denies N/V, B&B changes, unexplained weight fluctuation, saddle paresthesia, fever, night sweats, or unrelenting night pain at this time.    Limitations  Lifting;Standing;Walking;House hold activities    How long can you sit comfortably?  unlimited    How long can you stand comfortably?     How long can you walk comfortably?     Diagnostic tests  Xrays    Patient Stated Goals  Return to walk    Pain Onset  1 to 4 weeks ago       Ther-Ex Long sitting PF <>DF x15  Long sitting PF/DF/Inv/Eversion BluTB 2x 12 with cuing for eccentric control with good carry over Seated BAPS board L4 clockwise x15 and counterclockwise x15 circles with min cuing for full ROM Standing wt shifting x20 wt shift to L with R heel raise; no UE support which is an improvement from previous session  Standing narrow BOS balance x30sec; with R toe down x30sec; attempt in SLS; attempted SLS about 2sec over multiple trials  Standing L hip abd 2x 10 hip ext 2x 10 with ease; min cuing for posture  Gait Training Over 143ft without AD, with patient demonstrating good pain tolerance, 1x standing break with demonstration of L wt shift and increased R step length with TC following, good carry over, less vertical displacement following. Education on SLS and balance needed for this with good understanding.                           PT Education - 06/02/19 0844    Education Details  terex fom/technique, gait training    Person(s) Educated  Patient    Methods  Explanation;Demonstration;Verbal cues    Comprehension   Verbalized understanding;Returned demonstration;Verbal cues required       PT Short Term Goals - 05/24/19 1505      PT SHORT TERM GOAL #1   Title  Pt will be independent with HEP in order to decrease ankle pain and increase strength in order to improve pain-free function at home and work.    Baseline  05/23/19    Time  4    Period  Weeks    Status  New      PT SHORT TERM GOAL #2   Title  Patient will demonstrate full ankle PROM in order to demonstrate PLOF joint motion    Baseline  05/23/19 PF 38d DF 0d Inv 42d    Time  4    Period  Weeks    Status  New        PT Long Term Goals - 05/24/19 1300      PT LONG TERM GOAL #1   Title  Patient will increase FOTO score to 72 to demonstrate predicted increase in functional mobility to complete ADLs    Baseline  05/23/19 40    Time  8    Period  Weeks    Status  New      PT LONG TERM GOAL #2   Title  Pt will decrease worst pain as reported on NPRS by at least 3 points in order to demonstrate clinically significant reduction in ankle/foot pain.    Baseline  05/23/19 5/10 with weight bearing    Time  8    Period  Weeks    Status  New      PT LONG TERM GOAL #3   Title  Patient will demonstrate full L ankle AROM to demonstrate PLOF and motion needed for normalized gait and stair mechanics    Baseline  05/23/19 PF 30d DF -4d Inversion 33d Eversion: 26d    Time  8    Period  Weeks    Status  New      PT LONG TERM GOAL #4   Title  Patient will demonstrate normalized gait mechanics without AD in order to return to PLOF    Baseline  05/23/19 3 point with bilat crutches and boot decreased RLE step and LLE stance time    Time  8    Period  Weeks    Status  New            Plan - 06/02/19 0350    Clinical Impression Statement  PT continued therex progression for increased ankle strength, mobility and proprioception with increased weight bearing demand, with good success. Patient is able to comply with all cuing for progress toward  normalized gait and proper therex technique, with good motivation throughout session. Patient does report some increased pain  with balance therex, that subsides following rest break. PT will continue progression as able.    Personal Factors and Comorbidities  Age;Behavior Pattern;Comorbidity 1;Fitness;Profession    Comorbidities  overweight    Examination-Activity Limitations  Squat;Lift;Stairs;Stand;Locomotion Level;Transfers    Examination-Participation Restrictions  Shop;Community Activity;Meal Prep;Yard Work    Merchant navy officer  Evolving/Moderate complexity    Clinical Decision Making  Moderate    Rehab Potential  Good    PT Frequency  2x / week    PT Duration  8 weeks    PT Treatment/Interventions  ADLs/Self Care Home Management;Electrical Stimulation;Therapeutic activities;Patient/family education;Joint Manipulations;Spinal Manipulations;Passive range of motion;Manual techniques;Dry needling;Functional mobility training;Cryotherapy;Ultrasound;Neuromuscular re-education;Balance training;Therapeutic exercise;Orthotic Fit/Training;DME Instruction;Iontophoresis 4mg /ml Dexamethasone;Aquatic Therapy;Moist Heat;Gait training;Stair training;Traction    PT Next Visit Plan  crutch wean    PT Home Exercise Plan  calf stretch, AROM DF/PF, wt shifting    Consulted and Agree with Plan of Care  Patient       Patient will benefit from skilled therapeutic intervention in order to improve the following deficits and impairments:  Abnormal gait, Pain, Improper body mechanics, Increased fascial restricitons, Postural dysfunction, Impaired tone, Decreased scar mobility, Decreased coordination, Decreased activity tolerance, Decreased endurance, Decreased range of motion, Decreased strength, Decreased balance, Difficulty walking, Impaired flexibility  Visit Diagnosis: Pain in left ankle and joints of left foot  Stiffness of left ankle, not elsewhere classified     Problem List There are  no problems to display for this patient.  Shelton Silvas PT, DPT Shelton Silvas 06/02/2019, 9:16 AM  [place] [facility] AND SPORTS MEDICINE [address], [place], [address] Phone: [phone]   Fax:  [phone]  Name: SHATONIA HOOTS MRN: [id number] Date of Birth: Mar 11, 2000

## 2019-06-06 ENCOUNTER — Ambulatory Visit: Payer: Medicaid Other | Admitting: Physical Therapy

## 2019-06-06 ENCOUNTER — Other Ambulatory Visit: Payer: Self-pay

## 2019-06-06 ENCOUNTER — Encounter: Payer: Self-pay | Admitting: Physical Therapy

## 2019-06-06 DIAGNOSIS — M25572 Pain in left ankle and joints of left foot: Secondary | ICD-10-CM

## 2019-06-06 DIAGNOSIS — M25672 Stiffness of left ankle, not elsewhere classified: Secondary | ICD-10-CM

## 2019-06-06 NOTE — Therapy (Signed)
[place] [facility] MEDICINE [address], [place], [address] Phone: [phone]   Fax:  [phone]  Physical Therapy Treatment  Patient Details  Name: Diane Turner MRN: [id number] Date of Birth: 2000/08/22 No data recorded  Encounter Date: 06/06/2019  PT End of Session - 06/06/19 1705    Visit Number  5    Number of Visits  17    Date for PT Re-Evaluation  07/18/19    PT Start Time  0452    PT Stop Time  0530    PT Time Calculation (min)  38 min    Activity Tolerance  Patient tolerated treatment well    Behavior During Therapy  [facility] for tasks assessed/performed       Past Medical History:  Diagnosis Date  . Medical history non-contributory     Past Surgical History:  Procedure Laterality Date  . NO PAST SURGERIES    . ORIF ANKLE FRACTURE Left 03/23/2019   Procedure: OPEN REDUCTION INTERNAL FIXATION (ORIF) ANKLE FRACTURE BIMALLEOLAR LEFT;  Surgeon: Rosetta Posner, DPM;  Location: [facility];  Service: Podiatry;  Laterality: Left;  CHOICE WITH PREOP POP/SAPH BLOCK    There were no vitals filed for this visit.  Subjective Assessment - 06/06/19 1656    Subjective  Patient is ambulating without crutches into clinic and reports that she has been trying to use them less over the weekend with good success. Reports she is waiting on her brace to come in. Reports 3/10 pain average over the weekend    Pertinent History  Pt is a 19 year old female presenting s/p bimallelor displaced ankle fracture, with surgical repair 03/23/19 with cast removal on 05/03/19.Patient is completing exercises at home per surgeon and is currently wearing a boot, except for exercising, showering, and sleeping. Patient reports she is having a follow up 05/31/19, has been instructed to wear boot until this appointment. Patient is currently ambulating with bilat crutches and boot, and what she estimates is 60% WB on LLE. Per notes in chart patient is  able to 100% WB on LLE tomorrow, but patient does not seem aware of this. 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 Worst pain in past week: 5/10 best: 0/10. reports surgical pain with increasing wt through LLE. Pt denies N/V, B&B changes, unexplained weight fluctuation, saddle paresthesia, fever, night sweats, or unrelenting night pain at this time.    Limitations  Lifting;Standing;Walking;House hold activities    How long can you sit comfortably?  unlimited    How long can you stand comfortably?     How long can you walk comfortably?     Diagnostic tests  Xrays    Patient Stated Goals  Return to walk    Pain Onset  1 to 4 weeks ago          Ther-Ex Recumbant bike no resistance with AAROM with RLE  SeatedBAPS board L4clockwise x15 and counterclockwise x15 circles with min cuing for full ROM BAPS L4 PF <> DF 2x 12 with float good carry over Standing wt shifting x20 wt shift to L with R heel raise; no UE Standing narrow BOS balance x30sec; with R toe down x30sec; R tandem x30sec; attempt in SLS; attempted SLS about 2sec over multiple trials but better control this session  Mini squat x10 with min cuing for RLE wt shift with good carry over; with LLE on balance stone 2x 10 with cuing needed to prevent hip rotation with good carry over Step up onto 4in step with light unilateral touch on treadmill bar 3x 10 with cuing for decreased LLE PF/push off with good carry over Standing bilat hip abd 2x 10 GTB difficulty with posture with RLE wt bearing, better with cuing                         PT Education - 06/06/19 1704    Education Details  therex form/technique    Person(s) Educated  Patient    Methods  Explanation;Demonstration;Verbal cues    Comprehension  Verbalized understanding;Returned  demonstration;Verbal cues required       PT Short Term Goals - 05/24/19 1505      PT SHORT TERM GOAL #1   Title  Pt will be independent with HEP in order to decrease ankle pain and increase strength in order to improve pain-free function at home and work.    Baseline  05/23/19    Time  4    Period  Weeks    Status  New      PT SHORT TERM GOAL #2   Title  Patient will demonstrate full ankle PROM in order to demonstrate PLOF joint motion    Baseline  05/23/19 PF 38d DF 0d Inv 42d    Time  4    Period  Weeks    Status  New        PT Long Term Goals - 05/24/19 1300      PT LONG TERM GOAL #1   Title  Patient will increase FOTO score to 72 to demonstrate predicted increase in functional mobility to complete ADLs    Baseline  05/23/19 40    Time  8    Period  Weeks    Status  New      PT LONG TERM GOAL #2   Title  Pt will decrease worst pain as reported on NPRS by at least 3 points in order to demonstrate clinically significant reduction in ankle/foot pain.    Baseline  05/23/19 5/10 with weight bearing    Time  8    Period  Weeks    Status  New      PT LONG TERM GOAL #3   Title  Patient will demonstrate full L ankle AROM to demonstrate PLOF and motion needed for normalized gait and stair mechanics    Baseline  05/23/19 PF 30d DF -4d Inversion 33d Eversion: 26d    Time  8    Period  Weeks    Status  New      PT LONG TERM GOAL #4   Title  Patient will demonstrate normalized gait mechanics without AD in order to return to PLOF    Baseline  05/23/19 3 point with bilat crutches and boot decreased RLE step and LLE stance time    Time  8    Period  Weeks    Status  New            Plan - 06/06/19 1723    Clinical Impression Statement  PT continued therex progression for increased ankle strength, mobility, and proprioception with good success. Patient is able to tolerate increased WB of LLE with good techniques following cuing of therex, and good motivation without increased pain.  PT will continue progression as  able.    Personal Factors and Comorbidities  Age;Behavior Pattern;Comorbidity 1;Fitness;Profession    Comorbidities  overweight    Examination-Activity Limitations  Squat;Lift;Stairs;Stand;Locomotion Level;Transfers    Examination-Participation Restrictions  Shop;Community Activity;Meal Prep;Yard Work    Merchant navy officer  Evolving/Moderate complexity    Clinical Decision Making  Moderate    Rehab Potential  Good    PT Frequency  2x / week    PT Duration  8 weeks    PT Treatment/Interventions  ADLs/Self Care Home Management;Electrical Stimulation;Therapeutic activities;Patient/family education;Joint Manipulations;Spinal Manipulations;Passive range of motion;Manual techniques;Dry needling;Functional mobility training;Cryotherapy;Ultrasound;Neuromuscular re-education;Balance training;Therapeutic exercise;Orthotic Fit/Training;DME Instruction;Iontophoresis 4mg /ml Dexamethasone;Aquatic Therapy;Moist Heat;Gait training;Stair training;Traction    PT Next Visit Plan  continue POC    PT Home Exercise Plan  calf stretch, AROM DF/PF, wt shifting    Consulted and Agree with Plan of Care  Patient       Patient will benefit from skilled therapeutic intervention in order to improve the following deficits and impairments:  Abnormal gait, Pain, Improper body mechanics, Increased fascial restricitons, Postural dysfunction, Impaired tone, Decreased scar mobility, Decreased coordination, Decreased activity tolerance, Decreased endurance, Decreased range of motion, Decreased strength, Decreased balance, Difficulty walking, Impaired flexibility  Visit Diagnosis: Pain in left ankle and joints of left foot  Stiffness of left ankle, not elsewhere classified     Problem List There are no problems to display for this patient.  Shelton Silvas PT, DPT Shelton Silvas 06/06/2019, 5:28 PM  [place] [facility]  [facility]ICINE [address] [place], [address] Phone: [phone]   Fax:  [phone]  Name: Diane Turner MRN: [id number] Date of Birth: March 01, 2000

## 2019-06-08 ENCOUNTER — Encounter: Payer: Self-pay | Admitting: Physical Therapy

## 2019-06-08 ENCOUNTER — Ambulatory Visit: Payer: Medicaid Other | Admitting: Physical Therapy

## 2019-06-08 ENCOUNTER — Other Ambulatory Visit: Payer: Self-pay

## 2019-06-08 DIAGNOSIS — M25572 Pain in left ankle and joints of left foot: Secondary | ICD-10-CM | POA: Diagnosis not present

## 2019-06-08 DIAGNOSIS — M25672 Stiffness of left ankle, not elsewhere classified: Secondary | ICD-10-CM

## 2019-06-08 NOTE — Therapy (Signed)
[place] [facility] [address], [place], [address] Phone: [phone]   Fax:  [phone]  Physical Therapy Treatment  Patient Details  Name: Diane Turner MRN: [id number] Date of Birth: 12/11/2000 No data recorded  Encounter Date: 06/08/2019  PT End of Session - 06/08/19 1315    Visit Number  6    Number of Visits  17    Date for PT Re-Evaluation  07/18/19    PT Start Time  0110    PT Stop Time  0148    PT Time Calculation (min)  38 min    Activity Tolerance  Patient tolerated treatment well    Behavior During Therapy  [facility] for tasks assessed/performed       Past Medical History:  Diagnosis Date  . Medical history non-contributory     Past Surgical History:  Procedure Laterality Date  . NO PAST SURGERIES    . ORIF ANKLE FRACTURE Left 03/23/2019   Procedure: OPEN REDUCTION INTERNAL FIXATION (ORIF) ANKLE FRACTURE BIMALLEOLAR LEFT;  Surgeon: Rosetta Posner, DPM;  Location: [facility];  Service: Podiatry;  Laterality: Left;  CHOICE WITH PREOP POP/SAPH BLOCK    There were no vitals filed for this visit.  Subjective Assessment - 06/08/19 1313    Subjective  Reports she woke up today with increased pain, about 5/10 pain. Reports she had some soreness following last session. Is not using her crutch, and reports this is still going well.    Pertinent History  Pt is a 19 year old female presenting s/p bimallelor displaced ankle fracture, with surgical repair 03/23/19 with cast removal on 05/03/19.Patient is completing exercises at home per surgeon and is currently wearing a boot, except for exercising, showering, and sleeping. Patient reports she is having a follow up 05/31/19, has been instructed to wear boot until this appointment. Patient is currently ambulating with bilat crutches and boot, and what she estimates is 60% WB on LLE. Per notes in chart patient is able to 100% WB on LLE tomorrow, but patient does  not seem aware of this. 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 Worst pain in past week: 5/10 best: 0/10. reports surgical pain with increasing wt through LLE. Pt denies N/V, B&B changes, unexplained weight fluctuation, saddle paresthesia, fever, night sweats, or unrelenting night pain at this time.    Limitations  Lifting;Standing;Walking;House hold activities    How long can you sit comfortably?  unlimited    How long can you stand comfortably?     How long can you walk comfortably?     Diagnostic tests  Xrays    Patient Stated Goals  Return to walk         Ther-Ex Recumbant bike no resistance with AAROM with RLE  SeatedBAPS board L4clockwise x15 and counterclockwise x15 circles with min cuing for full ROM (mostly only with PF) BAPS L4 PF <> DF 2x 12 with float good carry over from previous session Seated heel raise 2x 10 with TC for technique with good carry over following  Seated DF BluTB  2x 10 with good carry over from previous session Seated slide into DF <> PF x3sec hold in each position x12 with heavy cuing and demonstration needed for proper technique without ankle eversion with good carry over  Standing wt shifting x20 wt shift to L with R heel raise; no UE Mini squat 3 x10 with min cuing for RLE wt shift with good carry over; no increased pain  Standing bilat hip abd 2x 10 GTB difficulty with posture with RLE wt bearing, better with cuing                        PT Education - 06/08/19 1315    Education Details  therex form/technique    Person(s) Educated  Patient    Methods  Explanation;Demonstration;Verbal cues    Comprehension  Verbalized understanding;Returned demonstration;Verbal cues required       PT Short Term Goals - 05/24/19 1505      PT SHORT TERM GOAL #1    Title  Pt will be independent with HEP in order to decrease ankle pain and increase strength in order to improve pain-free function at home and work.    Baseline  05/23/19    Time  4    Period  Weeks    Status  New      PT SHORT TERM GOAL #2   Title  Patient will demonstrate full ankle PROM in order to demonstrate PLOF joint motion    Baseline  05/23/19 PF 38d DF 0d Inv 42d    Time  4    Period  Weeks    Status  New        PT Long Term Goals - 05/24/19 1300      PT LONG TERM GOAL #1   Title  Patient will increase FOTO score to 72 to demonstrate predicted increase in functional mobility to complete ADLs    Baseline  05/23/19 40    Time  8    Period  Weeks    Status  New      PT LONG TERM GOAL #2   Title  Pt will decrease worst pain as reported on NPRS by at least 3 points in order to demonstrate clinically significant reduction in ankle/foot pain.    Baseline  05/23/19 5/10 with weight bearing    Time  8    Period  Weeks    Status  New      PT LONG TERM GOAL #3   Title  Patient will demonstrate full L ankle AROM to demonstrate PLOF and motion needed for normalized gait and stair mechanics    Baseline  05/23/19 PF 30d DF -4d Inversion 33d Eversion: 26d    Time  8    Period  Weeks    Status  New      PT LONG TERM GOAL #4   Title  Patient will demonstrate normalized gait mechanics without AD in order to return to PLOF    Baseline  05/23/19 3 point with bilat crutches and boot decreased RLE step and LLE stance time    Time  8    Period  Weeks    Status  New            Plan - 06/08/19 1320    Clinical Impression Statement  PT continued therex progression with some decreased WB demand d/t patient reporting some increased pain with this today. Patient is able to comply with all cuing for proper technique of therex with good carry over, and good motivation with miniscul increase in pain. PT will continue progression as able.    Personal Factors and Comorbidities  Age;Behavior  Pattern;Comorbidity 1;Fitness;Profession    Comorbidities  overweight  Examination-Activity Limitations  Squat;Lift;Stairs;Stand;Locomotion Level;Transfers    Examination-Participation Restrictions  Shop;Community Activity;Meal Prep;Yard Work    Merchant navy officer  Evolving/Moderate complexity    Clinical Decision Making  Moderate    Rehab Potential  Good    PT Frequency  2x / week    PT Duration  8 weeks    PT Treatment/Interventions  ADLs/Self Care Home Management;Electrical Stimulation;Therapeutic activities;Patient/family education;Joint Manipulations;Spinal Manipulations;Passive range of motion;Manual techniques;Dry needling;Functional mobility training;Cryotherapy;Ultrasound;Neuromuscular re-education;Balance training;Therapeutic exercise;Orthotic Fit/Training;DME Instruction;Iontophoresis 4mg /ml Dexamethasone;Aquatic Therapy;Moist Heat;Gait training;Stair training;Traction    PT Next Visit Plan  continue POC    PT Home Exercise Plan  calf stretch, AROM DF/PF, wt shifting    Consulted and Agree with Plan of Care  Patient       Patient will benefit from skilled therapeutic intervention in order to improve the following deficits and impairments:  Abnormal gait, Pain, Improper body mechanics, Increased fascial restricitons, Postural dysfunction, Impaired tone, Decreased scar mobility, Decreased coordination, Decreased activity tolerance, Decreased endurance, Decreased range of motion, Decreased strength, Decreased balance, Difficulty walking, Impaired flexibility  Visit Diagnosis: Pain in left ankle and joints of left foot  Stiffness of left ankle, not elsewhere classified     Problem List There are no problems to display for this patient.  Shelton Silvas PT, DPT Shelton Silvas 06/08/2019, 1:48 PM  [place] [facility] [address] [place], [address] Phone: [phone]   Fax:  [phone]  Name:  Diane Turner MRN: [id number] Date of Birth: 18-Dec-2000

## 2019-06-13 ENCOUNTER — Ambulatory Visit: Payer: Medicaid Other | Admitting: Physical Therapy

## 2019-06-13 ENCOUNTER — Other Ambulatory Visit: Payer: Self-pay

## 2019-06-13 ENCOUNTER — Encounter: Payer: Self-pay | Admitting: Physical Therapy

## 2019-06-13 DIAGNOSIS — M25672 Stiffness of left ankle, not elsewhere classified: Secondary | ICD-10-CM

## 2019-06-13 DIAGNOSIS — M25572 Pain in left ankle and joints of left foot: Secondary | ICD-10-CM

## 2019-06-13 NOTE — Therapy (Signed)
[place] [facility] [address], [place], [address] Phone: [phone]   Fax:  [phone]  Physical Therapy Treatment  Patient Details  Name: Diane Turner MRN: [id number] Date of Birth: 05/11/2000 No data recorded  Encounter Date: 06/13/2019  PT End of Session - 06/13/19 1659    Visit Number  7    Number of Visits  17    Date for PT Re-Evaluation  07/18/19    PT Start Time  0456    PT Stop Time  0534    PT Time Calculation (min)  38 min    Activity Tolerance  Patient tolerated treatment well    Behavior During Therapy  [facility] for tasks assessed/performed       Past Medical History:  Diagnosis Date  . Medical history non-contributory     Past Surgical History:  Procedure Laterality Date  . NO PAST SURGERIES    . ORIF ANKLE FRACTURE Left 03/23/2019   Procedure: OPEN REDUCTION INTERNAL FIXATION (ORIF) ANKLE FRACTURE BIMALLEOLAR LEFT;  Surgeon: Rosetta Posner, DPM;  Location: [facility];  Service: Podiatry;  Laterality: Left;  CHOICE WITH PREOP POP/SAPH BLOCK    There were no vitals filed for this visit.  Subjective Assessment - 06/13/19 1657    Subjective  Pt reports she is doing well overall, her brace is arriving on Friday. She reports 2/10 pain today, good compliance with HEP.    Pertinent History  Pt is a 19 year old female presenting s/p bimallelor displaced ankle fracture, with surgical repair 03/23/19 with cast removal on 05/03/19.Patient is completing exercises at home per surgeon and is currently wearing a boot, except for exercising, showering, and sleeping. Patient reports she is having a follow up 05/31/19, has been instructed to wear boot until this appointment. Patient is currently ambulating with bilat crutches and boot, and what she estimates is 60% WB on LLE. Per notes in chart patient is able to 100% WB on LLE tomorrow, but patient does not seem aware of this. 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 Worst pain in past week: 5/10 best: 0/10. reports surgical pain with increasing wt through LLE. Pt denies N/V, B&B changes, unexplained weight fluctuation, saddle paresthesia, fever, night sweats, or unrelenting night pain at this time.    Limitations  Lifting;Standing;Walking;House hold activities    How long can you sit comfortably?  unlimited    How long can you stand comfortably?     How long can you walk comfortably?     Diagnostic tests  Xrays    Patient Stated Goals  Return to walk    Pain Onset  1 to 4 weeks ago       Ther-Ex Recumbant bike no resistance with AAROM with RLE SeatedBAPS board L4 and L5clockwise x15 and counterclockwise x15 circles with min cuing for full ROM BAPS L5 PF <> DF 2x 12 with float good carry over Seated heel raise x10; with manual pressure 2x 10 with min cuing to "press toes into floor" with good carry over Seated DF with heel on floor 3x 10 with patient reporting difficulty with this in this position  Mini squat  with LLE on balance stone 3x 10 with cuing needed to prevent hip rotation with good carry over Ltep up onto  6in step with light unilateral touch on treadmill bar 3x 10 with cuing for decreased LLE PF/push off with good carry over Standing bilat hip abd 3x 10 GTB difficulty with posture with RLE wt bearing, better with cuing SLS x3 trials, best time 8sec                        PT Education - 06/13/19 1659    Education Details  therex form/technique    Person(s) Educated  Patient    Methods  Explanation;Demonstration;Tactile cues;Verbal cues    Comprehension  Verbalized understanding;Returned demonstration;Verbal cues required;Tactile cues required       PT Short Term Goals - 05/24/19 1505      PT SHORT TERM GOAL #1   Title  Pt  will be independent with HEP in order to decrease ankle pain and increase strength in order to improve pain-free function at home and work.    Baseline  05/23/19    Time  4    Period  Weeks    Status  New      PT SHORT TERM GOAL #2   Title  Patient will demonstrate full ankle PROM in order to demonstrate PLOF joint motion    Baseline  05/23/19 PF 38d DF 0d Inv 42d    Time  4    Period  Weeks    Status  New        PT Long Term Goals - 05/24/19 1300      PT LONG TERM GOAL #1   Title  Patient will increase FOTO score to 72 to demonstrate predicted increase in functional mobility to complete ADLs    Baseline  05/23/19 40    Time  8    Period  Weeks    Status  New      PT LONG TERM GOAL #2   Title  Pt will decrease worst pain as reported on NPRS by at least 3 points in order to demonstrate clinically significant reduction in ankle/foot pain.    Baseline  05/23/19 5/10 with weight bearing    Time  8    Period  Weeks    Status  New      PT LONG TERM GOAL #3   Title  Patient will demonstrate full L ankle AROM to demonstrate PLOF and motion needed for normalized gait and stair mechanics    Baseline  05/23/19 PF 30d DF -4d Inversion 33d Eversion: 26d    Time  8    Period  Weeks    Status  New      PT LONG TERM GOAL #4   Title  Patient will demonstrate normalized gait mechanics without AD in order to return to PLOF    Baseline  05/23/19 3 point with bilat crutches and boot decreased RLE step and LLE stance time    Time  8    Period  Weeks    Status  New            Plan - 06/13/19 1702    Clinical Impression Statement  PT continued therex progression with increased wt bearing with good tolerance. Patient is able to complete all therex with proper technique following multi-modal cuing, with good motivation throughout session. PT will continue progression as able.    Personal Factors and Comorbidities  Age;Behavior Pattern;Comorbidity 1;Fitness;Profession    Comorbidities  overweight     Examination-Activity Limitations  Squat;Lift;Stairs;Stand;Locomotion Level;Transfers    Examination-Participation Restrictions  Shop;Community Activity;Meal  Prep;Yard Work    Stability/Clinical Decision Making  Evolving/Moderate complexity    Clinical Decision Making  Moderate    Rehab Potential  Good    PT Frequency  2x / week    PT Duration  8 weeks    PT Treatment/Interventions  ADLs/Self Care Home Management;Electrical Stimulation;Therapeutic activities;Patient/family education;Joint Manipulations;Spinal Manipulations;Passive range of motion;Manual techniques;Dry needling;Functional mobility training;Cryotherapy;Ultrasound;Neuromuscular re-education;Balance training;Therapeutic exercise;Orthotic Fit/Training;DME Instruction;Iontophoresis 4mg /ml Dexamethasone;Aquatic Therapy;Moist Heat;Gait training;Stair training;Traction    PT Next Visit Plan  continue POC    PT Home Exercise Plan  calf stretch, AROM DF/PF, wt shifting    Consulted and Agree with Plan of Care  Patient       Patient will benefit from skilled therapeutic intervention in order to improve the following deficits and impairments:  Abnormal gait, Pain, Improper body mechanics, Increased fascial restricitons, Postural dysfunction, Impaired tone, Decreased scar mobility, Decreased coordination, Decreased activity tolerance, Decreased endurance, Decreased range of motion, Decreased strength, Decreased balance, Difficulty walking, Impaired flexibility  Visit Diagnosis: Pain in left ankle and joints of left foot  Stiffness of left ankle, not elsewhere classified     Problem List There are no problems to display for this patient.  Shelton Silvas PT, DPT Shelton Silvas 06/13/2019, 5:33 PM  [place] [facility] [address], [place], [address] Phone: [phone]   Fax:  [phone]  Name: MARIEKE LUBKE MRN: [id number] Date of Birth: 10/07/2000

## 2019-06-15 ENCOUNTER — Ambulatory Visit: Payer: Medicaid Other | Admitting: Physical Therapy

## 2019-06-16 ENCOUNTER — Other Ambulatory Visit: Payer: Self-pay

## 2019-06-16 ENCOUNTER — Ambulatory Visit: Payer: Medicaid Other

## 2019-06-16 DIAGNOSIS — M25572 Pain in left ankle and joints of left foot: Secondary | ICD-10-CM

## 2019-06-16 DIAGNOSIS — M25672 Stiffness of left ankle, not elsewhere classified: Secondary | ICD-10-CM

## 2019-06-16 NOTE — Therapy (Signed)
[place] [facility] [address], [place], [address] Phone: [phone]   Fax:  [phone]  Physical Therapy Treatment  Patient Details  Name: Diane Turner MRN: [id number] Date of Birth: May 09, 2000 No data recorded  Encounter Date: 06/16/2019  PT End of Session - 06/16/19 1016    Visit Number  8    Number of Visits  17    Date for PT Re-Evaluation  07/18/19    PT Start Time  1005    PT Stop Time  1045    PT Time Calculation (min)  40 min    Activity Tolerance  Patient tolerated treatment well;No increased pain    Behavior During Therapy  [facility] for tasks assessed/performed       Past Medical History:  Diagnosis Date  . Medical history non-contributory     Past Surgical History:  Procedure Laterality Date  . NO PAST SURGERIES    . ORIF ANKLE FRACTURE Left 03/23/2019   Procedure: OPEN REDUCTION INTERNAL FIXATION (ORIF) ANKLE FRACTURE BIMALLEOLAR LEFT;  Surgeon: Caroline More, DPM;  Location: [employer];  Service: Podiatry;  Laterality: Left;  CHOICE WITH PREOP POP/SAPH BLOCK    There were no vitals filed for this visit.  Subjective Assessment - 06/16/19 1012    Subjective  Pt reports she is doing well in general, still working in her HEP at home. Pain has been well controlled to nil.    Pertinent History  Pt is a 19 year old female presenting s/p bimalleolar displaced ankle fracture, with surgical repair 03/23/19 with cast removal on 05/03/19.Patient is completing exercises at home per surgeon and is currently wearing a boot, except for exercising, showering, and sleeping. Patient reports she is having a follow up 05/31/19, has been instructed to wear boot until this appointment. Patient is currently ambulating with bilat crutches and boot, and what she estimates is 60% WB on LLE. Per notes in chart patient is able to 100% WB on LLE 05/24/19, but patient does not seem aware of this. 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 Next FU with Dr. Luana Shu is on June 7.    Currently in Pain?  No/denies    Pain Score  0-No pain        INTERVENTION THIS DATE:  Ther-Ex Seated sagittal heel slides x 20, 3sec hold in DF and PF end range for stretch  Seated transverse plane heel slides x 20, 3sec hold in IV, EV end range for stretch, cues for full foot contact with floor  Seated heel raise, manual pressure 1x10, 2x15, forefoot on airex to allow for full tolerated ROM Seated DF with heel on airex: 1x15, 2x15 c 2lb AW at MTP line  Mini squat-> STS from chair + airex 2x15, cued to keep feet equal not pain (self selected movement avoids >10 degrees DF) Modified plantigrade LLE hip extension 1x15 c 3lb AW Modified plantigrade LLE hip ABDCT 1x15 c 3lb AW Tandem Balance 2x15sec each way      PT Short Term Goals - 06/16/19 1048      PT SHORT TERM GOAL #1   Title  Pt will be independent with HEP in order to decrease ankle pain and increase strength in order to improve pain-free function at home and work.    Baseline  05/23/19    Time  4    Period  Weeks    Status  On-going      PT SHORT TERM GOAL #2   Title  Patient will demonstrate full ankle PROM in order to demonstrate PLOF joint motion    Baseline  05/23/19 PF 38d DF 0d Inv 42d    Time  4    Period  Days    Status  On-going        PT Long Term Goals - 06/16/19 1048      PT LONG TERM GOAL #1   Title  Patient will increase FOTO score to 72 to demonstrate predicted increase in functional mobility to complete ADLs    Baseline  05/23/19 40    Time  8    Period  Weeks    Status  On-going      PT LONG TERM GOAL #2   Title  Pt will decrease worst pain as reported on NPRS by at least 3 points in order to demonstrate clinically significant reduction in ankle/foot pain.    Baseline  05/23/19  5/10 with weight bearing    Time  8    Period  Weeks    Status  On-going      PT LONG TERM GOAL #3   Title  Patient will demonstrate full L ankle AROM to demonstrate PLOF and motion needed for normalized gait and stair mechanics    Baseline  05/23/19 PF 30d DF -4d Inversion 33d Eversion: 26d    Time  8    Period  Weeks    Status  On-going      PT LONG TERM GOAL #4   Title  Patient will demonstrate normalized gait mechanics without AD in order to return to PLOF    Baseline  05/23/19 3 point with bilat crutches and boot decreased RLE step and LLE stance time    Time  8    Period  Weeks    Status  On-going            Plan - 06/16/19 1016    Clinical Impression Statement Pt able to complete entire session as planned with rest breaks provided as needed. Pt maintains high level of focus and motivation. Intermittent verbal, visual, and tactile cues are provided for most accurate form possible. Author provides multimodal cues for facilitating and teaching exercises. Overall pt continues to make steady progress toward treatment goals.    Personal Factors and Comorbidities  Age;Behavior Pattern;Comorbidity 1;Fitness;Profession    Comorbidities  overweight    Examination-Activity Limitations  Squat;Lift;Stairs;Stand;Locomotion Level;Transfers    Examination-Participation Restrictions  Shop;Community Activity;Meal Prep;Yard Work    Conservation officer, historic buildings  Evolving/Moderate complexity    Clinical Decision Making  Moderate    Rehab Potential  Good    PT Frequency  2x / week    PT Duration  8 weeks    PT Treatment/Interventions  ADLs/Self Care Home Management;Electrical Stimulation;Therapeutic activities;Patient/family education;Joint Manipulations;Spinal Manipulations;Passive range of motion;Manual techniques;Dry needling;Functional mobility training;Cryotherapy;Ultrasound;Neuromuscular re-education;Balance training;Therapeutic exercise;Orthotic Fit/Training;DME  Instruction;Iontophoresis 4mg /ml Dexamethasone;Aquatic Therapy;Moist Heat;Gait training;Stair training;Traction    PT Next Visit Plan  continue POC    PT Home Exercise Plan  calf stretch, AROM DF/PF, wt shifting    Consulted and Agree with Plan of Care  Patient       Patient will benefit from skilled therapeutic intervention in order to improve the following deficits and impairments:  Abnormal gait, Pain, Improper body mechanics, Increased fascial restricitons, Postural dysfunction, Impaired  tone, Decreased scar mobility, Decreased coordination, Decreased activity tolerance, Decreased endurance, Decreased range of motion, Decreased strength, Decreased balance, Difficulty walking, Impaired flexibility  Visit Diagnosis: Pain in left ankle and joints of left foot  Stiffness of left ankle, not elsewhere classified     Problem List There are no problems to display for this patient.  10:49 AM, 06/16/19 Rosamaria Lints, PT, DPT Physical Therapist - [place] [phone] (Office)    Tolbert Matheson C 06/16/2019, 10:49 AM  [place] [facility] [address] [place], [address] Phone: [phone]   Fax:  [phone]  Name: Diane Turner MRN: [id number] Date of Birth: November 07, 2000

## 2019-06-22 ENCOUNTER — Ambulatory Visit: Payer: Medicaid Other | Attending: Podiatry | Admitting: Physical Therapy

## 2019-06-22 DIAGNOSIS — M25672 Stiffness of left ankle, not elsewhere classified: Secondary | ICD-10-CM | POA: Insufficient documentation

## 2019-06-22 DIAGNOSIS — M25572 Pain in left ankle and joints of left foot: Secondary | ICD-10-CM | POA: Insufficient documentation

## 2019-06-26 ENCOUNTER — Other Ambulatory Visit: Payer: Self-pay

## 2019-06-26 ENCOUNTER — Ambulatory Visit: Payer: Medicaid Other | Admitting: Physical Therapy

## 2019-06-26 ENCOUNTER — Encounter: Payer: Self-pay | Admitting: Physical Therapy

## 2019-06-26 DIAGNOSIS — M25672 Stiffness of left ankle, not elsewhere classified: Secondary | ICD-10-CM | POA: Diagnosis present

## 2019-06-26 DIAGNOSIS — M25572 Pain in left ankle and joints of left foot: Secondary | ICD-10-CM | POA: Diagnosis not present

## 2019-06-26 NOTE — Therapy (Signed)
[facility] [address], [place], [address] Phone: [phone]   Fax:  [phone]  Physical Therapy Treatment  Patient Details  Name: Diane Turner MRN: [id number] Date of Birth: 2001-01-15 No data recorded  Encounter Date: 06/26/2019  PT End of Session - 06/26/19 1325    Visit Number  9    Number of Visits  17    Date for PT Re-Evaluation  07/18/19    PT Start Time  0104    PT Stop Time  0145    PT Time Calculation (min)  41 min    Activity Tolerance  Patient tolerated treatment well;No increased pain    Behavior During Therapy  WFL for tasks assessed/performed       Past Medical History:  Diagnosis Date  . Medical history non-contributory     Past Surgical History:  Procedure Laterality Date  . NO PAST SURGERIES    . ORIF ANKLE FRACTURE Left 03/23/2019   Procedure: OPEN REDUCTION INTERNAL FIXATION (ORIF) ANKLE FRACTURE BIMALLEOLAR LEFT;  Surgeon: Rosetta Posner, DPM;  Location: [facility];  Service: Podiatry;  Laterality: Left;  CHOICE WITH PREOP POP/SAPH BLOCK    There were no vitals filed for this visit.  Subjective Assessment - 06/26/19 1307    Subjective  Patient arrives out of CAM boot, with support brace on per MD appt today. Reports 3/10 pain with WB. Compliance with HEP.    Pertinent History  Pt is a 19 year old female presenting s/p bimalleolar displaced ankle fracture, with surgical repair 03/23/19 with cast removal on 05/03/19.Patient is completing exercises at home per surgeon and is currently wearing a boot, except for exercising, showering, and sleeping. Patient reports she is having a follow up 05/31/19, has been instructed to wear boot until this appointment. Patient is currently ambulating with bilat crutches and boot, and what she estimates is 60% WB on LLE. Per notes in chart patient is able to 100% WB on LLE 05/24/19, but patient does not seem aware of this. 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 Next FU with Dr. Excell Seltzer is on June 7.    Limitations  Lifting;Standing;Walking;House hold activities    How long can you sit comfortably?  unlimited    How long can you stand comfortably?     How long can you walk comfortably?     Diagnostic tests  Xrays    Patient Stated Goals  Return to walk    Pain Onset  1 to 4 weeks ago       Ther-Ex Recumbant bike38mins no resistance with AAROM with RLE StandingBAPS board L and L4 clockwise x15 and counterclockwise x15 circles with min cuing for full ROM SLS trails x3 about 5sec; tandem stance 30sec Side stepping with GTB 3x 10R 10L with min cuing for foot alignment and foot clearance with good carry over Tandem walking 3x 10 with supervision for safety good carry over Toe taps onto 3in step x20 with cuing for "light tap"; with increased speed x20; with increased speed on toes x10 with difficulty with R toe tap Standing bilat heel raise 3 x10; with decent height  PT Education - 06/26/19 1324    Education Details  therex form/technique    Person(s) Educated  Patient    Methods  Explanation;Demonstration;Verbal cues    Comprehension  Verbalized understanding;Returned demonstration;Verbal cues required       PT Short Term Goals - 06/16/19 1048      PT SHORT TERM GOAL #1   Title  Pt will be independent with HEP in order to decrease ankle pain and increase strength in order to improve pain-free function at home and work.    Baseline  05/23/19    Time  4    Period  Weeks    Status  On-going      PT SHORT TERM GOAL #2   Title  Patient will demonstrate full ankle PROM in order to demonstrate PLOF joint motion    Baseline  05/23/19 PF 38d DF 0d Inv 42d    Time  4    Period  Days    Status   On-going        PT Long Term Goals - 06/16/19 1048      PT LONG TERM GOAL #1   Title  Patient will increase FOTO score to 72 to demonstrate predicted increase in functional mobility to complete ADLs    Baseline  05/23/19 40    Time  8    Period  Weeks    Status  On-going      PT LONG TERM GOAL #2   Title  Pt will decrease worst pain as reported on NPRS by at least 3 points in order to demonstrate clinically significant reduction in ankle/foot pain.    Baseline  05/23/19 5/10 with weight bearing    Time  8    Period  Weeks    Status  On-going      PT LONG TERM GOAL #3   Title  Patient will demonstrate full L ankle AROM to demonstrate PLOF and motion needed for normalized gait and stair mechanics    Baseline  05/23/19 PF 30d DF -4d Inversion 33d Eversion: 26d    Time  8    Period  Weeks    Status  On-going      PT LONG TERM GOAL #4   Title  Patient will demonstrate normalized gait mechanics without AD in order to return to PLOF    Baseline  05/23/19 3 point with bilat crutches and boot decreased RLE step and LLE stance time    Time  8    Period  Weeks    Status  On-going            Plan - 06/26/19 1337    Clinical Impression Statement  PT continued therex progression for increased RLE weight bearing, balance, and strength with good success. Patient is able to comply with all cuing for proper technique of therex, and is motivated throughout session with some increased pain with increased RLE weight bearing that quickly subsides. PT will continue progression as able.    Personal Factors and Comorbidities  Age;Behavior Pattern;Comorbidity 1;Fitness;Profession    Comorbidities  overweight    Examination-Activity Limitations  Squat;Lift;Stairs;Stand;Locomotion Level;Transfers    Examination-Participation Restrictions  Shop;Community Activity;Meal Prep;Yard Work    Merchant navy officer  Evolving/Moderate complexity    Clinical Decision Making  Moderate    Rehab  Potential  Good    PT Frequency  2x / week    PT Duration  8 weeks    PT Treatment/Interventions  ADLs/Self Care Home Management;Electrical Stimulation;Therapeutic activities;Patient/family  education;Joint Manipulations;Spinal Manipulations;Passive range of motion;Manual techniques;Dry needling;Functional mobility training;Cryotherapy;Ultrasound;Neuromuscular re-education;Balance training;Therapeutic exercise;Orthotic Fit/Training;DME Instruction;Iontophoresis 4mg /ml Dexamethasone;Aquatic Therapy;Moist Heat;Gait training;Stair training;Traction    PT Next Visit Plan  continue POC    PT Home Exercise Plan  calf stretch, AROM DF/PF, wt shifting    Consulted and Agree with Plan of Care  Patient       Patient will benefit from skilled therapeutic intervention in order to improve the following deficits and impairments:  Abnormal gait, Pain, Improper body mechanics, Increased fascial restricitons, Postural dysfunction, Impaired tone, Decreased scar mobility, Decreased coordination, Decreased activity tolerance, Decreased endurance, Decreased range of motion, Decreased strength, Decreased balance, Difficulty walking, Impaired flexibility  Visit Diagnosis: Pain in left ankle and joints of left foot  Stiffness of left ankle, not elsewhere classified     Problem List There are no problems to display for this patient.  PT, DPT Staci Acosta 06/26/2019, 1:41 PM  [facility] SPORTS [facility]IC[facility] [address], [address], [place] Phone: [phone]   Fax:  [phone]  Name: Diane Turner MRN: Leta Baptist Date of Birth: 10-26-2000

## 2019-06-29 ENCOUNTER — Other Ambulatory Visit: Payer: Self-pay

## 2019-06-29 ENCOUNTER — Ambulatory Visit: Payer: Medicaid Other | Admitting: Physical Therapy

## 2019-06-29 ENCOUNTER — Encounter: Payer: Self-pay | Admitting: Physical Therapy

## 2019-06-29 DIAGNOSIS — M25572 Pain in left ankle and joints of left foot: Secondary | ICD-10-CM | POA: Diagnosis not present

## 2019-06-29 DIAGNOSIS — M25672 Stiffness of left ankle, not elsewhere classified: Secondary | ICD-10-CM

## 2019-06-29 NOTE — Therapy (Signed)
[place] [facility] [address] [place], [address] Phone: [phone]   Fax:  [phone]  Physical Therapy Treatment  Patient Details  Name: Diane Turner MRN: [id number] Date of Birth: Dec 29, 2000 No data recorded  Encounter Date: 06/29/2019   PT End of Session - 06/29/19 1343    Visit Number 10    Number of Visits 17    Date for PT Re-Evaluation 07/18/19    PT Start Time 0130    PT Stop Time 0215    PT Time Calculation (min) 45 min    Activity Tolerance Patient tolerated treatment well;No increased pain    Behavior During Therapy WFL for tasks assessed/performed           Past Medical History:  Diagnosis Date  . Medical history non-contributory     Past Surgical History:  Procedure Laterality Date  . NO PAST SURGERIES    . ORIF ANKLE FRACTURE Left 03/23/2019   Procedure: OPEN REDUCTION INTERNAL FIXATION (ORIF) ANKLE FRACTURE BIMALLEOLAR LEFT;  Surgeon: Rosetta Posner, DPM;  Location: [facility];  Service: Podiatry;  Laterality: Left;  CHOICE WITH PREOP POP/SAPH BLOCK    There were no vitals filed for this visit.   Subjective Assessment - 06/29/19 1338    Subjective Patient reports she is doing well with HEP, minimal pain today, and is feeling better out of boot today.    Pertinent History Pt is a 19 year old female presenting s/p bimalleolar displaced ankle fracture, with surgical repair 03/23/19 with cast removal on 05/03/19.Patient is completing exercises at home per surgeon and is currently wearing a boot, except for exercising, showering, and sleeping. Patient reports she is having a follow up 05/31/19, has been instructed to wear boot until this appointment. Patient is currently ambulating with bilat crutches and boot, and what she estimates is 60% WB on LLE. Per notes in chart patient is able to 100% WB on LLE 05/24/19, but patient does not seem aware of this. 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 Next FU with Dr. Excell Seltzer is on June 7.    Limitations Lifting;Standing;Walking;House hold activities    How long can you sit comfortably? unlimited    How long can you stand comfortably?    How long can you walk comfortably?    Diagnostic tests Xrays    Patient Stated Goals Return to walk    Pain Onset 1 to 4 weeks ago             Ther-Ex Recumbant bike75mins L2 for low level resistance Bilat heel raise 3x 10 with min cuing to "push forefoot into floor" Bilat toe raise 2x 10 with min cuing for full DF with good carry over  DF walks 2x 17ft with min cuing to maintain DF with good carry over PF <> DF on bosu (hardside) 2x 10 with min cuing for ankle motion without hip compensation with good carry over Supination <> pronation lateral movement on bosu ball (hardside) with continued cuing for motion "from ankles" with good carry over SLS trails x3 about 5sec; tandem stance 30sec R and L Ball toss/catch 2kg in L tandem 2x 10; on foam x10 with  Side stepping with GTB 3x 10R 10L with min cuing for foot alignment and foot clearance with  good carry over Tandem walking 3x 10 with supervision for safety good carry over Toe taps onto 3in step with moderate speed 3x 20; unable to complete small hop, but  Much more confident this session                             PT Education - 06/29/19 1339    Education Details therex form/technique    Person(s) Educated Patient    Methods Explanation;Demonstration;Verbal cues    Comprehension Verbalized understanding;Returned demonstration;Verbal cues required            PT Short Term Goals - 06/16/19 1048      PT SHORT TERM GOAL #1   Title Pt will be independent with HEP in order to decrease ankle pain and increase strength in order to  improve pain-free function at home and work.    Baseline 05/23/19    Time 4    Period Weeks    Status On-going      PT SHORT TERM GOAL #2   Title Patient will demonstrate full ankle PROM in order to demonstrate PLOF joint motion    Baseline 05/23/19 PF 38d DF 0d Inv 42d    Time 4    Period Days    Status On-going             PT Long Term Goals - 06/16/19 1048      PT LONG TERM GOAL #1   Title Patient will increase FOTO score to 72 to demonstrate predicted increase in functional mobility to complete ADLs    Baseline 05/23/19 40    Time 8    Period Weeks    Status On-going      PT LONG TERM GOAL #2   Title Pt will decrease worst pain as reported on NPRS by at least 3 points in order to demonstrate clinically significant reduction in ankle/foot pain.    Baseline 05/23/19 5/10 with weight bearing    Time 8    Period Weeks    Status On-going      PT LONG TERM GOAL #3   Title Patient will demonstrate full L ankle AROM to demonstrate PLOF and motion needed for normalized gait and stair mechanics    Baseline 05/23/19 PF 30d DF -4d Inversion 33d Eversion: 26d    Time 8    Period Weeks    Status On-going      PT LONG TERM GOAL #4   Title Patient will demonstrate normalized gait mechanics without AD in order to return to PLOF    Baseline 05/23/19 3 point with bilat crutches and boot decreased RLE step and LLE stance time    Time 8    Period Weeks    Status On-going                 Plan - 06/29/19 1412    Clinical Impression Statement PT continued therex progression for increased RLE weight bearing, balance, and strength with continued successs. Paitent is able to tolerate progression well without increased pain, and is able to comply with cuing for proper technique/form of therex. PT will continue progression as able.    Personal Factors and Comorbidities Age;Behavior Pattern;Comorbidity 1;Fitness;Profession    Comorbidities overweight    Examination-Activity Limitations  Squat;Lift;Stairs;Stand;Locomotion Level;Transfers    Examination-Participation Restrictions Shop;Community Activity;Meal Prep;Yard Work    Stability/Clinical Decision Making Evolving/Moderate complexity    Clinical Decision Making Moderate    Rehab Potential Good  PT Frequency 2x / week    PT Duration 8 weeks    PT Treatment/Interventions ADLs/Self Care Home Management;Electrical Stimulation;Therapeutic activities;Patient/family education;Joint Manipulations;Spinal Manipulations;Passive range of motion;Manual techniques;Dry needling;Functional mobility training;Cryotherapy;Ultrasound;Neuromuscular re-education;Balance training;Therapeutic exercise;Orthotic Fit/Training;DME Instruction;Iontophoresis 4mg /ml Dexamethasone;Aquatic Therapy;Moist Heat;Gait training;Stair training;Traction    PT Next Visit Plan continue POC    PT Home Exercise Plan calf stretch, AROM DF/PF, wt shifting    Consulted and Agree with Plan of Care Patient           Patient will benefit from skilled therapeutic intervention in order to improve the following deficits and impairments:  Abnormal gait, Pain, Improper body mechanics, Increased fascial restricitons, Postural dysfunction, Impaired tone, Decreased scar mobility, Decreased coordination, Decreased activity tolerance, Decreased endurance, Decreased range of motion, Decreased strength, Decreased balance, Difficulty walking, Impaired flexibility  Visit Diagnosis: Pain in left ankle and joints of left foot  Stiffness of left ankle, not elsewhere classified     Problem List There are no problems to display for this patient.  Durwin Reges DPT Durwin Reges 06/29/2019, 2:20 PM  [place] [facility] [address], [place], [address] Phone: [phone]   Fax:  [phone]  Name: Diane Turner MRN: [id number] Date of Birth: [birth year]

## 2019-07-04 ENCOUNTER — Ambulatory Visit: Payer: Medicaid Other | Admitting: Physical Therapy

## 2019-07-04 ENCOUNTER — Other Ambulatory Visit: Payer: Self-pay

## 2019-07-04 ENCOUNTER — Encounter: Payer: Self-pay | Admitting: Physical Therapy

## 2019-07-04 DIAGNOSIS — M25572 Pain in left ankle and joints of left foot: Secondary | ICD-10-CM

## 2019-07-04 DIAGNOSIS — M25672 Stiffness of left ankle, not elsewhere classified: Secondary | ICD-10-CM

## 2019-07-04 NOTE — Therapy (Signed)
[place] [facility] [address], [place], [address] Phone: [phone]   Fax:  [phone]  Physical Therapy Treatment  Patient Details  Name: Diane Turner MRN: [id number] Date of Birth: 2000-07-15 No data recorded  Encounter Date: 07/04/2019   PT End of Session - 07/04/19 1444    Visit Number 11    Number of Visits 17    Date for PT Re-Evaluation 07/18/19    PT Start Time 0236    PT Stop Time 0315    PT Time Calculation (min) 39 min    Activity Tolerance Patient tolerated treatment well;No increased pain    Behavior During Therapy WFL for tasks assessed/performed           Past Medical History:  Diagnosis Date  . Medical history non-contributory     Past Surgical History:  Procedure Laterality Date  . NO PAST SURGERIES    . ORIF ANKLE FRACTURE Left 03/23/2019   Procedure: OPEN REDUCTION INTERNAL FIXATION (ORIF) ANKLE FRACTURE BIMALLEOLAR LEFT;  Surgeon: Rosetta Posner, DPM;  Location: [facility];  Service: Podiatry;  Laterality: Left;  CHOICE WITH PREOP POP/SAPH BLOCK    There were no vitals filed for this visit.   Subjective Assessment - 07/04/19 1440    Subjective Patient reports minimal pain today, is doing well overall andis feeling more stable in her boot. Compliance with HEP    Pertinent History Pt is a 19 year old female presenting s/p bimalleolar displaced ankle fracture, with surgical repair 03/23/19 with cast removal on 05/03/19.Patient is completing exercises at home per surgeon and is currently wearing a boot, except for exercising, showering, and sleeping. Patient reports she is having a follow up 05/31/19, has been instructed to wear boot until this appointment. Patient is currently ambulating with bilat crutches and boot, and what she estimates is 60% WB on LLE. Per notes in chart patient is able to 100% WB on LLE 05/24/19, but patient does not seem aware of this. 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 Next FU with Dr. Excell Seltzer is on June 7.    Limitations Lifting;Standing;Walking;House hold activities    How long can you sit comfortably? unlimited    How long can you stand comfortably?    How long can you walk comfortably?    Diagnostic tests Xrays    Patient Stated Goals Return to walk    Pain Onset 1 to 4 weeks ago          Ther-Ex Recumbant bike25mins L3 for low level resistance Bilat toe raise from step 3x 10 with min cuing to "push forefoot into floor" Bilat heel raise 3x 10 with eccentric focus DF walks 2x 26ft with min cuing to maintain DF with good carry over PF <> DF on bosu (hardside) 2x 10 with min cuing for ankle motion without hip compensation with good carry over Alt reverse lunge 3x 10 with difficulty with balance, w/o UE support Alt lateral lunge 3x 10 with heavy cuing to prevent L ankle eversion, and for large step back with RLE to increase LLE stance demand SLS trails x3 about 5sec; tandem stance 30sec R and L Ball toss/catch 2kg in L tandem on foam x10 with  Toe taps onto 3in step with moderate speed 3x 20; unable  to complete small hop, but  Much more confident this session   PT reviewed the following HEP with patient with patient able to demonstrate the following with min cuing for correction needed. PT educated patient on parameters of therex (how/when to inc/decrease intensity, frequency, rep/set range, stretch hold time, and purpose of therex) with verbalized understanding.  Access Code: [email or web address] Exercises Heel Toe Raises with Counter Support - 1 x daily - 2-3 x weekly - 3 sets - 10 reps Heel Raises with Unilateral Counter Support - 1 x daily - 2-3 x weekly - 3 sets - 10 reps Reverse Lunge - 1 x daily - 2-3 x weekly - 3 sets - 10 reps Single Leg Stance - 3 x daily  - 7 x weekly - 30sec hold                             PT Education - 07/04/19 1443    Education Details therex form/technique    Person(s) Educated Patient    Methods Explanation;Demonstration;Tactile cues;Verbal cues    Comprehension Verbalized understanding;Returned demonstration;Verbal cues required            PT Short Term Goals - 06/16/19 1048      PT SHORT TERM GOAL #1   Title Pt will be independent with HEP in order to decrease ankle pain and increase strength in order to improve pain-free function at home and work.    Baseline 05/23/19    Time 4    Period Weeks    Status On-going      PT SHORT TERM GOAL #2   Title Patient will demonstrate full ankle PROM in order to demonstrate PLOF joint motion    Baseline 05/23/19 PF 38d DF 0d Inv 42d    Time 4    Period Days    Status On-going             PT Long Term Goals - 06/16/19 1048      PT LONG TERM GOAL #1   Title Patient will increase FOTO score to 72 to demonstrate predicted increase in functional mobility to complete ADLs    Baseline 05/23/19 40    Time 8    Period Weeks    Status On-going      PT LONG TERM GOAL #2   Title Pt will decrease worst pain as reported on NPRS by at least 3 points in order to demonstrate clinically significant reduction in ankle/foot pain.    Baseline 05/23/19 5/10 with weight bearing    Time 8    Period Weeks    Status On-going      PT LONG TERM GOAL #3   Title Patient will demonstrate full L ankle AROM to demonstrate PLOF and motion needed for normalized gait and stair mechanics    Baseline 05/23/19 PF 30d DF -4d Inversion 33d Eversion: 26d    Time 8    Period Weeks    Status On-going      PT LONG TERM GOAL #4   Title Patient will demonstrate normalized gait mechanics without AD in order to return to PLOF    Baseline 05/23/19 3 point with bilat crutches and boot decreased RLE step and LLE stance time    Time 8    Period Weeks    Status On-going                  Plan - 07/04/19 1506    Clinical  Impression Statement PT continued therex progression for increased RLE WB, balance, and strength with good success. Patient is able to comply with all cuing for proper technique, with good motivation without increased pain. PT updated HEP to reflect strengthening progression with patient verbalizing good understanding. PT will continue progression as able.    Personal Factors and Comorbidities Age;Behavior Pattern;Comorbidity 1;Fitness;Profession    Comorbidities overweight    Examination-Activity Limitations Squat;Lift;Stairs;Stand;Locomotion Level;Transfers    Examination-Participation Restrictions Shop;Community Activity;Meal Prep;Yard Work    Conservation officer, historic buildings Evolving/Moderate complexity    Clinical Decision Making Moderate    Rehab Potential Good    PT Frequency 2x / week    PT Duration 8 weeks    PT Treatment/Interventions ADLs/Self Care Home Management;Electrical Stimulation;Therapeutic activities;Patient/family education;Joint Manipulations;Spinal Manipulations;Passive range of motion;Manual techniques;Dry needling;Functional mobility training;Cryotherapy;Ultrasound;Neuromuscular re-education;Balance training;Therapeutic exercise;Orthotic Fit/Training;DME Instruction;Iontophoresis 4mg /ml Dexamethasone;Aquatic Therapy;Moist Heat;Gait training;Stair training;Traction    PT Next Visit Plan continue POC    PT Home Exercise Plan calf stretch, AROM DF/PF, wt shifting    Consulted and Agree with Plan of Care Patient           Patient will benefit from skilled therapeutic intervention in order to improve the following deficits and impairments:  Abnormal gait, Pain, Improper body mechanics, Increased fascial restricitons, Postural dysfunction, Impaired tone, Decreased scar mobility, Decreased coordination, Decreased activity tolerance, Decreased endurance, Decreased range of motion, Decreased strength, Decreased balance,  Difficulty walking, Impaired flexibility  Visit Diagnosis: Pain in left ankle and joints of left foot  Stiffness of left ankle, not elsewhere classified     Problem List There are no problems to display for this patient.  DPT  Hilda Lias 07/04/2019, 3:19 PM  [place] [facility] PHYS[facility] AND SPORTS MEDICINE [address], [place] Phone: [phone]   Fax:  [phone]  Name: Diane Turner MRN: Leta Baptist Date of Birth: 03-01-2000

## 2019-07-06 ENCOUNTER — Ambulatory Visit: Payer: Medicaid Other | Admitting: Physical Therapy

## 2019-07-06 ENCOUNTER — Encounter: Payer: Self-pay | Admitting: Physical Therapy

## 2019-07-06 ENCOUNTER — Other Ambulatory Visit: Payer: Self-pay

## 2019-07-06 DIAGNOSIS — M25572 Pain in left ankle and joints of left foot: Secondary | ICD-10-CM

## 2019-07-06 DIAGNOSIS — M25672 Stiffness of left ankle, not elsewhere classified: Secondary | ICD-10-CM

## 2019-07-06 NOTE — Therapy (Signed)
[facility] [address], [place], [address] Phone: [phone]   Fax:  [phone]  Physical Therapy Treatment  Patient Details  Name: Diane Turner MRN: [id number] Date of Birth: 11-14-2000 No data recorded  Encounter Date: 07/06/2019   PT End of Session - 07/06/19 1625    Visit Number 12    Number of Visits 17    Date for PT Re-Evaluation 07/18/19    PT Start Time 1620    PT Stop Time 1702    PT Time Calculation (min) 42 min    Activity Tolerance Patient tolerated treatment well;No increased pain    Behavior During Therapy WFL for tasks assessed/performed           Past Medical History:  Diagnosis Date  . Medical history non-contributory     Past Surgical History:  Procedure Laterality Date  . NO PAST SURGERIES    . ORIF ANKLE FRACTURE Left 03/23/2019   Procedure: OPEN REDUCTION INTERNAL FIXATION (ORIF) ANKLE FRACTURE BIMALLEOLAR LEFT;  Surgeon: Rosetta Posner, DPM;  Location: [facility];  Service: Podiatry;  Laterality: Left;  CHOICE WITH PREOP POP/SAPH BLOCK    There were no vitals filed for this visit.   Subjective Assessment - 07/06/19 1623    Subjective Pt reports no pain today, pt is still wearing her brace and reports that has helped a lot.  Compliance with HEP.    Pertinent History Pt is a 19 year old female presenting s/p bimalleolar displaced ankle fracture, with surgical repair 03/23/19 with cast removal on 05/03/19.Patient is completing exercises at home per surgeon and is currently wearing a boot, except for exercising, showering, and sleeping. Patient reports she is having a follow up 05/31/19, has been instructed to wear boot until this appointment. Patient is currently ambulating with bilat crutches and boot, and what she estimates is 60% WB on LLE. Per notes in chart patient is able to 100% WB on LLE 05/24/19, but patient does not seem aware of this. 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 Next FU with Dr. Excell Seltzer is on June 7.    Limitations Lifting;Standing;Walking;House hold activities    How long can you sit comfortably? unlimited    How long can you stand comfortably?    How long can you walk comfortably?    Diagnostic tests Xrays    Patient Stated Goals Return to walk    Currently in Pain? No/denies    Pain Onset 1 to 4 weeks ago            TherEx Recumbent bike L3 for low level resistance PF <> DF on bosu (hardside) 2x 10 with min cuing for ankle motion without hip compensation SLS LLE with finger touch on bar, x7sec, x3sec, x9sec, x10sec Bilat heel raise 3x10 from 3" step with cueing for eccentric focus and increased WB on left SL heel raise trial, unsuccessful d/t lack of strength DF walks 3x7ft with min cuing to maintain DF with good carry over Alt reverse lunge 3x 10 w/o UE support with difficulty with balance and cueing for increased BOS with decent carry over Alt lateral lunge 2x 10 with min cuing to prevent L ankle eversion and increase step back with good carry over [employer] in L tandem  on foam 2x12 with     PT Education - 07/06/19 1625    Education Details therex form/technique    Person(s) Educated Patient    Methods Explanation;Demonstration;Verbal cues    Comprehension Verbalized understanding;Returned demonstration;Verbal cues required            PT Short Term Goals - 06/16/19 1048      PT SHORT TERM GOAL #1   Title Pt will be independent with HEP in order to decrease ankle pain and increase strength in order to improve pain-free function at home and work.    Baseline 05/23/19    Time 4    Period Weeks    Status On-going      PT SHORT TERM GOAL #2   Title Patient will demonstrate full ankle PROM in order to demonstrate PLOF  joint motion    Baseline 05/23/19 PF 38d DF 0d Inv 42d    Time 4    Period Days    Status On-going             PT Long Term Goals - 06/16/19 1048      PT LONG TERM GOAL #1   Title Patient will increase FOTO score to 72 to demonstrate predicted increase in functional mobility to complete ADLs    Baseline 05/23/19 40    Time 8    Period Weeks    Status On-going      PT LONG TERM GOAL #2   Title Pt will decrease worst pain as reported on NPRS by at least 3 points in order to demonstrate clinically significant reduction in ankle/foot pain.    Baseline 05/23/19 5/10 with weight bearing    Time 8    Period Weeks    Status On-going      PT LONG TERM GOAL #3   Title Patient will demonstrate full L ankle AROM to demonstrate PLOF and motion needed for normalized gait and stair mechanics    Baseline 05/23/19 PF 30d DF -4d Inversion 33d Eversion: 26d    Time 8    Period Weeks    Status On-going      PT LONG TERM GOAL #4   Title Patient will demonstrate normalized gait mechanics without AD in order to return to PLOF    Baseline 05/23/19 3 point with bilat crutches and boot decreased RLE step and LLE stance time    Time 8    Period Weeks    Status On-going                 Plan - 07/06/19 1628    Clinical Impression Statement PT continued therex progression for increased RLE WB, balance, and strength with good success.  Pt able to achieve SLS on LLE for 10 sec with intermittent unilat light touch.  Pt able to perform therex with min cueing and good carryover throughout.  Pt reports no increased pain throughout session with some muscle fatigue towards the end of the session.  PT will continue progression as able.    Personal Factors and Comorbidities Age;Behavior Pattern;Comorbidity 1;Fitness;Profession    Comorbidities overweight    Examination-Activity Limitations Squat;Lift;Stairs;Stand;Locomotion Level;Transfers    Stability/Clinical Decision Making Evolving/Moderate complexity     Clinical Decision Making Moderate    Rehab Potential Good    PT Frequency 2x / week    PT Duration 8 weeks    PT Treatment/Interventions ADLs/Self Care Home Management;Electrical Stimulation;Therapeutic activities;Patient/family education;Joint Manipulations;Spinal Manipulations;Passive range of motion;Manual techniques;Dry needling;Functional mobility training;Cryotherapy;Ultrasound;Neuromuscular re-education;Balance training;Therapeutic exercise;Orthotic Fit/Training;DME  Instruction;Iontophoresis 4mg /ml Dexamethasone;Aquatic Therapy;Moist Heat;Gait training;Stair training;Traction    PT Next Visit Plan continue POC    PT Home Exercise Plan calf stretch, AROM DF/PF, wt shifting    Consulted and Agree with Plan of Care Patient           Patient will benefit from skilled therapeutic intervention in order to improve the following deficits and impairments:  Abnormal gait, Pain, Improper body mechanics, Increased fascial restricitons, Postural dysfunction, Impaired tone, Decreased scar mobility, Decreased coordination, Decreased activity tolerance, Decreased endurance, Decreased range of motion, Decreased strength, Decreased balance, Difficulty walking, Impaired flexibility  Visit Diagnosis: Pain in left ankle and joints of left foot  Stiffness of left ankle, not elsewhere classified     Problem List There are no problems to display for this patient.   Durwin Reges DPT Chinita Greenland, SPT Durwin Reges 07/07/2019, 11:00 AM  [place] [facility] [address] [place], [address] Phone: [phone]   Fax:  [phone]  Name: NAESHA BUCKALEW MRN: [id number] Date of Birth: 27-Oct-2000

## 2019-07-11 ENCOUNTER — Other Ambulatory Visit: Payer: Self-pay

## 2019-07-11 ENCOUNTER — Ambulatory Visit: Payer: Medicaid Other | Admitting: Physical Therapy

## 2019-07-11 ENCOUNTER — Encounter: Payer: Self-pay | Admitting: Physical Therapy

## 2019-07-11 DIAGNOSIS — M25672 Stiffness of left ankle, not elsewhere classified: Secondary | ICD-10-CM

## 2019-07-11 DIAGNOSIS — M25572 Pain in left ankle and joints of left foot: Secondary | ICD-10-CM | POA: Diagnosis not present

## 2019-07-11 NOTE — Therapy (Signed)
[place] [facility] [address], [place], [address] Phone: [phone]   Fax:  [phone]  Physical Therapy Treatment  Patient Details  Name: Diane Turner MRN: [id number] Date of Birth: [birth year] No data recorded  Encounter Date: 07/11/2019   PT End of Session - 07/11/19 1526    Visit Number 13    Number of Visits 17    Date for PT Re-Evaluation 07/18/19    PT Start Time 1520    PT Stop Time 1600    PT Time Calculation (min) 40 min    Activity Tolerance Patient tolerated treatment well;No increased pain    Behavior During Therapy WFL for tasks assessed/performed           Past Medical History:  Diagnosis Date  . Medical history non-contributory     Past Surgical History:  Procedure Laterality Date  . NO PAST SURGERIES    . ORIF ANKLE FRACTURE Left 03/23/2019   Procedure: OPEN REDUCTION INTERNAL FIXATION (ORIF) ANKLE FRACTURE BIMALLEOLAR LEFT;  Surgeon: Rosetta Posner, DPM;  Location: [facility];  Service: Podiatry;  Laterality: Left;  CHOICE WITH PREOP POP/SAPH BLOCK    There were no vitals filed for this visit.   Subjective Assessment - 07/11/19 1524    Subjective Pt feels good overall, minimal pain today. Has been working on exercises at home, which she thinks is helping. Says her SL balance has improved    Pertinent History Pt is a 19 year old female presenting s/p bimalleolar displaced ankle fracture, with surgical repair 03/23/19 with cast removal on 05/03/19.Patient is completing exercises at home per surgeon and is currently wearing a boot, except for exercising, showering, and sleeping. Patient reports she is having a follow up 05/31/19, has been instructed to wear boot until this appointment. Patient is currently ambulating with bilat crutches and boot, and what she estimates is 60% WB on LLE. Per notes in chart patient is able to 100% WB on LLE 05/24/19, but patient does not seem aware of this.  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 Next FU with Dr. Excell Seltzer is on June 7.    Limitations Lifting;Standing;Walking;House hold activities    How long can you sit comfortably? unlimited    How long can you stand comfortably?    How long can you walk comfortably?    Patient Stated Goals Return to walk           TherEx Recumbent bike L3 for low level resistance PF <> DF on bosu (hardside) 2x 10 with min cuing for ankle motion without hip compensation and cueing to increase L PF SLS LLE with finger touch on bar x25sec, 2x30sec with intermittent finger touch on bar Bilat heel raise 3x10 from 3" step with cueing for eccentric focus and increased WB on left SL heel raise trial, unsuccessful d/t lack of strength DF walks 3x59ft with min cuing to maintain DF with good carry over SL mini squat LLE 2x10 with B UE support with min cueing for hip hinge and upright posture to reduce L lateral leaning Alt reverse lunge 2x10 w/o UE support with cueing for increased BOS with decent carry over [employer] in L tandem on foam x20      PT Education - 07/11/19 1525  Education Details therex form/technique    Person(s) Educated Patient    Methods Explanation;Demonstration;Verbal cues    Comprehension Verbalized understanding;Returned demonstration;Verbal cues required            PT Short Term Goals - 06/16/19 1048      PT SHORT TERM GOAL #1   Title Pt will be independent with HEP in order to decrease ankle pain and increase strength in order to improve pain-free function at home and work.    Baseline 05/23/19    Time 4    Period Weeks    Status On-going      PT SHORT TERM GOAL #2   Title Patient will demonstrate full ankle PROM in order to demonstrate PLOF joint motion    Baseline 05/23/19 PF 38d DF  0d Inv 42d    Time 4    Period Days    Status On-going             PT Long Term Goals - 06/16/19 1048      PT LONG TERM GOAL #1   Title Patient will increase FOTO score to 72 to demonstrate predicted increase in functional mobility to complete ADLs    Baseline 05/23/19 40    Time 8    Period Weeks    Status On-going      PT LONG TERM GOAL #2   Title Pt will decrease worst pain as reported on NPRS by at least 3 points in order to demonstrate clinically significant reduction in ankle/foot pain.    Baseline 05/23/19 5/10 with weight bearing    Time 8    Period Weeks    Status On-going      PT LONG TERM GOAL #3   Title Patient will demonstrate full L ankle AROM to demonstrate PLOF and motion needed for normalized gait and stair mechanics    Baseline 05/23/19 PF 30d DF -4d Inversion 33d Eversion: 26d    Time 8    Period Weeks    Status On-going      PT LONG TERM GOAL #4   Title Patient will demonstrate normalized gait mechanics without AD in order to return to PLOF    Baseline 05/23/19 3 point with bilat crutches and boot decreased RLE step and LLE stance time    Time 8    Period Weeks    Status On-going                 Plan - 07/11/19 1554    Clinical Impression Statement PT continued therex progression for increaesd RLE WB, balance, and strength with good success.  PT progressed to SLS CC therex with good success with no increase in pain.   Pt able to achieve SLS on LLE for 30 sec with intermittent unilat light touch. Pt able to perform therex with min cueing and good carryover throughout.  PT will continue progression as able.    Personal Factors and Comorbidities Age;Behavior Pattern;Comorbidity 1;Fitness;Profession    Comorbidities overweight    Examination-Activity Limitations Squat;Lift;Stairs;Stand;Locomotion Level;Transfers    Stability/Clinical Decision Making Evolving/Moderate complexity    Clinical Decision Making Moderate    Rehab Potential Good    PT  Frequency 2x / week    PT Duration 8 weeks    PT Treatment/Interventions ADLs/Self Care Home Management;Electrical Stimulation;Therapeutic activities;Patient/family education;Joint Manipulations;Spinal Manipulations;Passive range of motion;Manual techniques;Dry needling;Functional mobility training;Cryotherapy;Ultrasound;Neuromuscular re-education;Balance training;Therapeutic exercise;Orthotic Fit/Training;DME Instruction;Iontophoresis 4mg /ml Dexamethasone;Aquatic Therapy;Moist Heat;Gait training;Stair training;Traction    PT Next Visit Plan continue POC with SLS therex  PT Home Exercise Plan calf stretch, AROM DF/PF, wt shifting    Consulted and Agree with Plan of Care Patient           Patient will benefit from skilled therapeutic intervention in order to improve the following deficits and impairments:  Abnormal gait, Pain, Improper body mechanics, Increased fascial restricitons, Postural dysfunction, Impaired tone, Decreased scar mobility, Decreased coordination, Decreased activity tolerance, Decreased endurance, Decreased range of motion, Decreased strength, Decreased balance, Difficulty walking, Impaired flexibility  Visit Diagnosis: Pain in left ankle and joints of left foot  Stiffness of left ankle, not elsewhere classified     Problem List There are no problems to display for this patient.  Hilda Lias DPT Ricarda Frame, SPT Hilda Lias 07/11/2019, 5:10 PM  [facility] PH[facility] SPORTS MEDICINE [address], [place], [address] Phone: [phone]   Fax:  [phone]  Name: LADENA JACQUEZ MRN: [id number] Date of Birth: [birth year]

## 2019-07-13 ENCOUNTER — Ambulatory Visit: Payer: Medicaid Other | Admitting: Physical Therapy

## 2019-07-13 ENCOUNTER — Encounter: Payer: Self-pay | Admitting: Physical Therapy

## 2019-07-13 ENCOUNTER — Other Ambulatory Visit: Payer: Self-pay

## 2019-07-13 DIAGNOSIS — M25672 Stiffness of left ankle, not elsewhere classified: Secondary | ICD-10-CM

## 2019-07-13 DIAGNOSIS — M25572 Pain in left ankle and joints of left foot: Secondary | ICD-10-CM | POA: Diagnosis not present

## 2019-07-13 NOTE — Therapy (Signed)
[place] [facility] [address] [place], [address] Phone: [phone]   Fax:  [phone]  Physical Therapy Treatment  Patient Details  Name: Diane Turner MRN: [id number] Date of Birth: 2000-12-09 No data recorded  Encounter Date: 07/13/2019   PT End of Session - 07/13/19 1353    Visit Number 14    Number of Visits 17    Date for PT Re-Evaluation 07/18/19    PT Start Time 0150    PT Stop Time 0230    PT Time Calculation (min) 40 min    Activity Tolerance Patient tolerated treatment well;No increased pain    Behavior During Therapy WFL for tasks assessed/performed           Past Medical History:  Diagnosis Date  . Medical history non-contributory     Past Surgical History:  Procedure Laterality Date  . NO PAST SURGERIES    . ORIF ANKLE FRACTURE Left 03/23/2019   Procedure: OPEN REDUCTION INTERNAL FIXATION (ORIF) ANKLE FRACTURE BIMALLEOLAR LEFT;  Surgeon: Rosetta Posner, DPM;  Location: [facility];  Service: Podiatry;  Laterality: Left;  CHOICE WITH PREOP POP/SAPH BLOCK    There were no vitals filed for this visit.   Subjective Assessment - 07/13/19 1350    Subjective Pt reports no pain today. Reports her home exercises are going well, and helping her a lot. Is continuing to wear her brace as well. She was able to walk around [facility] yesterday and is able to walk about before pain and fatigue increase.    Pertinent History Pt is a 19 year old female presenting s/p bimalleolar displaced ankle fracture, with surgical repair 03/23/19 with cast removal on 05/03/19.Patient is completing exercises at home per surgeon and is currently wearing a boot, except for exercising, showering, and sleeping. Patient reports she is having a follow up 05/31/19, has been instructed to wear boot until this appointment. Patient is currently ambulating with bilat crutches and boot, and what she estimates is 60% WB on LLE.  Per notes in chart patient is able to 100% WB on LLE 05/24/19, but patient does not seem aware of this. 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 Next FU with Dr. Excell Seltzer is on June 7.    Limitations Lifting;Standing;Walking;House hold activities    How long can you sit comfortably? unlimited    How long can you stand comfortably?    How long can you walk comfortably?    Diagnostic tests Xrays    Patient Stated Goals Return to walk    Pain Onset 1 to 4 weeks ago             TherEx Recumbent bike L3 for low level resistance L heel raise 3x 10 with RLE forefoot on floor and 32finger support on treadmill bar for balance; cuing to prevent forward translation compensation with good carry over DF from step 2x 10 cuing for eccentric control with good carry over DF Walk 2x 28ft; 1x 73ft with min cuing to maintain DF with good carry over Attempted SL deadlift patient unable; bilat hip hinge with good carry over of cuing; LLE hip hinge with RLE on slider 3x 6 with demo and heavy cuing for technique with good carry over SL squat RUE support 3x 6 with  min cuing for alignment initially with good carry over R reverse lunge to hip flex with LLE on foam 3x 6 with occasional UE support needed to maintain balance; good ankle and hip strategy                          PT Education - 07/13/19 1353    Education Details therex form/technique    Person(s) Educated Patient    Methods Explanation;Demonstration;Verbal cues    Comprehension Verbalized understanding;Returned demonstration;Verbal cues required            PT Short Term Goals - 06/16/19 1048      PT SHORT TERM GOAL #1   Title Pt will be independent with HEP in order to decrease ankle pain and increase strength in order to improve pain-free  function at home and work.    Baseline 05/23/19    Time 4    Period Weeks    Status On-going      PT SHORT TERM GOAL #2   Title Patient will demonstrate full ankle PROM in order to demonstrate PLOF joint motion    Baseline 05/23/19 PF 38d DF 0d Inv 42d    Time 4    Period Days    Status On-going             PT Long Term Goals - 06/16/19 1048      PT LONG TERM GOAL #1   Title Patient will increase FOTO score to 72 to demonstrate predicted increase in functional mobility to complete ADLs    Baseline 05/23/19 40    Time 8    Period Weeks    Status On-going      PT LONG TERM GOAL #2   Title Pt will decrease worst pain as reported on NPRS by at least 3 points in order to demonstrate clinically significant reduction in ankle/foot pain.    Baseline 05/23/19 5/10 with weight bearing    Time 8    Period Weeks    Status On-going      PT LONG TERM GOAL #3   Title Patient will demonstrate full L ankle AROM to demonstrate PLOF and motion needed for normalized gait and stair mechanics    Baseline 05/23/19 PF 30d DF -4d Inversion 33d Eversion: 26d    Time 8    Period Weeks    Status On-going      PT LONG TERM GOAL #4   Title Patient will demonstrate normalized gait mechanics without AD in order to return to PLOF    Baseline 05/23/19 3 point with bilat crutches and boot decreased RLE step and LLE stance time    Time 8    Period Weeks    Status On-going                 Plan - 07/13/19 1415    Clinical Impression Statement PT continued therex progression for increased LLE WB, balance and strength with good success. Pt is able to tolerate progression with no increased pain, and good carry over of multi-modal cuing for proper technique of therex. PT will continue progression as able.    Personal Factors and Comorbidities Age;Behavior Pattern;Comorbidity 1;Fitness;Profession    Examination-Activity Limitations Squat;Lift;Stairs;Stand;Locomotion Level;Transfers    Examination-Participation  Restrictions Shop;Community Activity;Meal Prep;Yard Work    Stability/Clinical Decision Making Evolving/Moderate complexity    Clinical Decision Making Moderate    Rehab Potential Good    PT Frequency 2x / week  PT Duration 8 weeks    PT Treatment/Interventions ADLs/Self Care Home Management;Electrical Stimulation;Therapeutic activities;Patient/family education;Joint Manipulations;Spinal Manipulations;Passive range of motion;Manual techniques;Dry needling;Functional mobility training;Cryotherapy;Ultrasound;Neuromuscular re-education;Balance training;Therapeutic exercise;Orthotic Fit/Training;DME Instruction;Iontophoresis 4mg /ml Dexamethasone;Aquatic Therapy;Moist Heat;Gait training;Stair training;Traction    PT Next Visit Plan continue POC with SLS therex    PT Home Exercise Plan calf stretch, AROM DF/PF, wt shifting    Consulted and Agree with Plan of Care Patient           Patient will benefit from skilled therapeutic intervention in order to improve the following deficits and impairments:  Abnormal gait, Pain, Improper body mechanics, Increased fascial restricitons, Postural dysfunction, Impaired tone, Decreased scar mobility, Decreased coordination, Decreased activity tolerance, Decreased endurance, Decreased range of motion, Decreased strength, Decreased balance, Difficulty walking, Impaired flexibility  Visit Diagnosis: Pain in left ankle and joints of left foot  Stiffness of left ankle, not elsewhere classified     Problem List There are no problems to display for this patient.  DPT Hilda Lias 07/13/2019, 2:25 PM  [place] [facility] PH[facility] AND SP[facility]TS [facility]IC[facility] [address], [place] Phone: [phone]   Fax:  [phone]  Name: MAIAH SINNING MRN: Leta Baptist Date of Birth: [birth year]

## 2019-07-18 ENCOUNTER — Encounter: Payer: Self-pay | Admitting: Physical Therapy

## 2019-07-18 ENCOUNTER — Ambulatory Visit: Payer: Medicaid Other | Admitting: Physical Therapy

## 2019-07-18 ENCOUNTER — Other Ambulatory Visit: Payer: Self-pay

## 2019-07-18 DIAGNOSIS — M25572 Pain in left ankle and joints of left foot: Secondary | ICD-10-CM | POA: Diagnosis not present

## 2019-07-18 DIAGNOSIS — M25672 Stiffness of left ankle, not elsewhere classified: Secondary | ICD-10-CM

## 2019-07-18 NOTE — Therapy (Signed)
[facility] [address] [place], [address] Phone: [phone]   Fax:  [phone]  Physical Therapy Treatment/Progress Report Reporting Period 05/23/19 - 07/18/19  Patient Details  Name: Diane Turner MRN: [id number] Date of Birth: 2000-09-27 No data recorded  Encounter Date: 07/18/2019   PT End of Session - 07/18/19 1414    Visit Number 15    Number of Visits 21    Date for PT Re-Evaluation 08/18/19    PT Start Time 1350    PT Stop Time 1430    PT Time Calculation (min) 40 min    Activity Tolerance Patient tolerated treatment well;No increased pain    Behavior During Therapy WFL for tasks assessed/performed           Past Medical History:  Diagnosis Date  . Medical history non-contributory     Past Surgical History:  Procedure Laterality Date  . NO PAST SURGERIES    . ORIF ANKLE FRACTURE Left 03/23/2019   Procedure: OPEN REDUCTION INTERNAL FIXATION (ORIF) ANKLE FRACTURE BIMALLEOLAR LEFT;  Surgeon: Caroline More, DPM;  Location: [facility];  Service: Podiatry;  Laterality: Left;  CHOICE WITH PREOP POP/SAPH BLOCK    There were no vitals filed for this visit.   Subjective Assessment - 07/18/19 1354    Subjective Pt reports no pain today and that she has been performing her HEP at home with good success.  Pt is still wearing brace, with decreased use at home.  Pt reports she is still able to walk about 25 minutes before pain increases.    Pertinent History Pt is a 19 year old female presenting s/p bimalleolar displaced ankle fracture, with surgical repair 03/23/19 with cast removal on 05/03/19.Patient is completing exercises at home per surgeon and is currently wearing a boot, except for exercising, showering, and sleeping. Patient reports she is having a follow up 05/31/19, has been instructed to wear boot until this appointment. Patient is currently ambulating with bilat crutches and boot, and what  she estimates is 60% WB on LLE. Per notes in chart patient is able to 100% WB on LLE 05/24/19, but patient does not seem aware of this. 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 Next FU with Dr. Luana Shu is on June 7.    Limitations Lifting;Standing;Walking;House hold activities    How long can you sit comfortably? unlimited    How long can you stand comfortably? 61mns    How long can you walk comfortably? 524ms    Diagnostic tests Xrays    Patient Stated Goals Return to walk    Currently in Pain? No/denies    Pain Onset 1 to 4 weeks ago           TherEx -Pt goals reassessed this session -LLE P/AROM  PROM: DF 15d, PF 70d, Eversion 35d, Inversion 45d  AROM: DF 8d, PF 50d, Eversion 30d, Inversion 40d -SLS: L: 11 seconds, R: 60 seconds -SL heel raise: L: unable to perform independently; x10 with modified with R toe touch WB, R: 17 with full range -Gait in clinic [id number] AD or brace with reciprocal gait with slightly decreased RLE step and LLE stance time, with increased eversion of R foot and some pain noted with push off  -Alternating backwards lunge x10 with cueing for  increased BOS for improved balance -L SL squat with BUE support for balance x10 with cueing for increase hip hinge and use of UE for balance only -L SL deadlift x10 with toe touch for balance     PT Education - 07/18/19 1358    Education Details Therex form/technique; new HEP    Person(s) Educated Patient    Methods Explanation;Demonstration;Verbal cues    Comprehension Verbalized understanding;Returned demonstration            PT Short Term Goals - 07/18/19 1431      PT SHORT TERM GOAL #1   Title Pt will be independent with HEP in order to decrease ankle pain and increase strength in order to improve pain-free function at home and work.     Baseline 07/18/19 Updated HEP 05/23/19    Time 4    Period Weeks    Status On-going    Target Date 08/15/19      PT SHORT TERM GOAL #2   Title Patient will demonstrate full ankle PROM in order to demonstrate PLOF joint motion    Baseline 07/18/19 DF 15d, PF 70d, Eversion 35d, Inversion 45d; 05/23/19 PF 38d DF 0d Inv 42d    Time 4    Period Days    Status Partially Met    Target Date 08/15/19             PT Long Term Goals - 07/18/19 1434      PT LONG TERM GOAL #1   Title Patient will increase FOTO score to 72 to demonstrate predicted increase in functional mobility to complete ADLs    Baseline 07/18/19 FOTO 58; 05/23/19 40    Time 8    Period Weeks    Status On-going      PT LONG TERM GOAL #2   Title Pt will decrease worst pain as reported on NPRS by at least 3 points in order to demonstrate clinically significant reduction in ankle/foot pain.    Baseline 07/18/19 Worst pain 6/10 with L SL WB; 05/23/19 5/10 with weight bearing    Time 8    Period Weeks    Status On-going      PT LONG TERM GOAL #3   Title Patient will demonstrate full L ankle AROM to demonstrate PLOF and motion needed for normalized gait and stair mechanics    Baseline 07/18/19 DF 8d, PF 50d, Eversion 30d, Inversion 40d; 05/23/19 PF 30d DF -4d Inversion 33d Eversion: 26d    Time 8    Period Weeks    Status On-going      PT LONG TERM GOAL #4   Title Patient will demonstrate normalized gait mechanics without AD in order to return to PLOF    Baseline 06/2919 Reciprocal gait without AD and slightly decreased RLE step length and LLE stance time with increased eversion L foot and slight pain noted with L push off;  05/23/19 3 point with bilat crutches and boot decreased RLE step and LLE stance time    Time 8    Period Weeks    Status On-going      PT LONG TERM GOAL #5   Title Pt will increse R SLB to 45 seconds to indicate SLB equivalent to pt population age norms.    Baseline 07/18/19 R SLB 11 seconds    Time 4    Period  Weeks      Additional Long Term Goals   Additional Long Term Goals Yes      PT  LONG TERM GOAL #6   Title Pt will perform 17 L SL heel raises with full range to indicate LLE strength equivalent to RLE.    Baseline 07/18/19 pt unable to perform SL heel raise on L independently    Time 4    Period Weeks                 Plan - 07/18/19 1451    Clinical Impression Statement Today PT reassessed pt goal progress.  Pt demonstrates significant progression towards goal achievement and notably improved passive/active L ankle ROM with limitations remaining primarily in DF.  Pt expresses she would like to continue to improve her L SLB and closed chain strength.  PT educated on reducing use of brace; limiting use at home, and wearing only with community activities that involve increased walking distances.  Pt educated on new HEP and is able to perform exercises with good carry over of verbal and visual cues.  Pt will continue to benefit from skilled therapy to improve R ankle mobility, strength, and stability for full pain-free return to ADLs and community participation.    Personal Factors and Comorbidities Age;Behavior Pattern;Comorbidity 1;Fitness;Profession    Examination-Activity Limitations Squat;Lift;Stairs;Stand;Locomotion Level;Transfers    Examination-Participation Restrictions Shop;Community Activity;Meal Prep;Yard Work    [employer]chant [employer] officer Evolving/Moderate complexity    Clinical Decision Making Moderate    Rehab Potential Good    PT Frequency 2x / week    PT Duration 8 weeks    PT Treatment/Interventions ADLs/Self Care Home Management;Electrical Stimulation;Therapeutic activities;Patient/family education;Joint Manipulations;Spinal Manipulations;Passive range of motion;Manual techniques;Dry needling;Functional mobility training;Cryotherapy;Ultrasound;Neuromuscular re-education;Balance training;Therapeutic exercise;Orthotic Fit/Training;DME Instruction;Iontophoresis '4mg'$ /ml  Dexamethasone;Aquatic Therapy;Moist Heat;Gait training;Stair training;Traction    PT Next Visit Plan continue POC with SLS therex wtihout brace    PT Home Exercise Plan calf stretch, AROM DF/PF, wt shifting    Consulted and Agree with Plan of Care Patient           Patient will benefit from skilled therapeutic intervention in order to improve the following deficits and impairments:  Abnormal gait, Pain, Improper body mechanics, Increased fascial restricitons, Postural dysfunction, Impaired tone, Decreased scar mobility, Decreased coordination, Decreased activity tolerance, Decreased endurance, Decreased range of motion, Decreased strength, Decreased balance, Difficulty walking, Impaired flexibility  Visit Diagnosis: Pain in left ankle and joints of left foot  Stiffness of left ankle, not elsewhere classified     Problem List There are no problems to display for this patient.  Durwin Reges DPT Chinita Greenland, SPT Durwin Reges 07/18/2019, 3:17 PM  [facility] [address], [place], [address] Phone: [phone]   Fax:  [phone]  Name: Diane Turner MRN: [id number] Date of Birth: 2000-03-12

## 2019-07-20 ENCOUNTER — Other Ambulatory Visit: Payer: Self-pay

## 2019-07-20 ENCOUNTER — Encounter: Payer: Self-pay | Admitting: Physical Therapy

## 2019-07-20 ENCOUNTER — Ambulatory Visit: Payer: Medicaid Other | Attending: Podiatry | Admitting: Physical Therapy

## 2019-07-20 DIAGNOSIS — M25572 Pain in left ankle and joints of left foot: Secondary | ICD-10-CM | POA: Insufficient documentation

## 2019-07-20 DIAGNOSIS — M25672 Stiffness of left ankle, not elsewhere classified: Secondary | ICD-10-CM | POA: Diagnosis present

## 2019-07-20 NOTE — Therapy (Signed)
[facility] [address] [place], [address] Phone: [phone]   Fax:  [phone]  Physical Therapy Treatment  Patient Details  Name: Diane Turner MRN: [id number] Date of Birth: May 24, 2000 No data recorded  Encounter Date: 07/20/2019   PT End of Session - 07/20/19 1442    Visit Number 16    Number of Visits 21    Date for PT Re-Evaluation 08/18/19    PT Start Time 5701    PT Stop Time 1515    PT Time Calculation (min) 39 min    Activity Tolerance Patient tolerated treatment well;No increased pain    Behavior During Therapy WFL for tasks assessed/performed           Past Medical History:  Diagnosis Date  . Medical history non-contributory     Past Surgical History:  Procedure Laterality Date  . NO PAST SURGERIES    . ORIF ANKLE FRACTURE Left 03/23/2019   Procedure: OPEN REDUCTION INTERNAL FIXATION (ORIF) ANKLE FRACTURE BIMALLEOLAR LEFT;  Surgeon: Caroline More, DPM;  Location: [facility];  Service: Podiatry;  Laterality: Left;  CHOICE WITH PREOP POP/SAPH BLOCK    There were no vitals filed for this visit.   Subjective Assessment - 07/20/19 1439    Subjective Pt reports 2/10 pain in her L ankle today.  Compliance with HEP and is walking without brace at home; wears brace when leaving the house.  Pt reports doing a lot of walking and stairs yesterday with sufficient rest breaks between; was fatigued more than painful.    Pertinent History Pt is a 19 year old female presenting s/p bimalleolar displaced ankle fracture, with surgical repair 03/23/19 with cast removal on 05/03/19.Patient is completing exercises at home per surgeon and is currently wearing a boot, except for exercising, showering, and sleeping. Patient reports she is having a follow up 05/31/19, has been instructed to wear boot until this appointment. Patient is currently ambulating with bilat crutches and boot, and what she estimates  is 60% WB on LLE. Per notes in chart patient is able to 100% WB on LLE 05/24/19, but patient does not seem aware of this. 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 Next FU with Dr. Luana Shu is on June 7.    Limitations Lifting;Standing;Walking;House hold activities    How long can you sit comfortably? unlimited    How long can you stand comfortably? 101mns    How long can you walk comfortably? 527ms    Diagnostic tests Xrays    Patient Stated Goals Return to walk    Currently in Pain? Yes    Pain Score 2     Pain Location Ankle    Pain Orientation Left    Pain Onset 1 to 4 weeks ago            TherEx Recumbent bike [id number] for low level resistance L heel raise 3x 10 with RLE forefoot on floor and [id number] support on treadmill bar for balance DF from step 3x 10 cuing for eccentric control with good carry over DF walk 2 x20ft34fh min cuing to maintain DF with good carry over SL squat with RUE support for balance 3x10 with cueing for hip hinge with good carry over  SL deadlift with toe touch for balance x10  SL deadlift 2x10 with intermittent UE with cueing for hip hinge with good carry over     PT Education - 07/20/19 1440    Education Details Therex from/technique    Person(s) Educated Patient    Methods Explanation;Demonstration;Verbal cues    Comprehension Verbalized understanding;Returned demonstration            PT Short Term Goals - 07/18/19 1431      PT SHORT TERM GOAL #1   Title Pt will be independent with HEP in order to decrease ankle pain and increase strength in order to improve pain-free function at home and work.    Baseline 07/18/19 Updated HEP 05/23/19    Time 4    Period Weeks    Status On-going    Target Date 08/15/19      PT SHORT TERM GOAL #2   Title Patient will demonstrate full ankle PROM  in order to demonstrate PLOF joint motion    Baseline 07/18/19 DF 15d, PF 70d, Eversion 35d, Inversion 45d; 05/23/19 PF 38d DF 0d Inv 42d    Time 4    Period Days    Status Partially Met    Target Date 08/15/19             PT Long Term Goals - 07/18/19 1434      PT LONG TERM GOAL #1   Title Patient will increase FOTO score to 72 to demonstrate predicted increase in functional mobility to complete ADLs    Baseline 07/18/19 FOTO 58; 05/23/19 40    Time 8    Period Weeks    Status On-going      PT LONG TERM GOAL #2   Title Pt will decrease worst pain as reported on NPRS by at least 3 points in order to demonstrate clinically significant reduction in ankle/foot pain.    Baseline 07/18/19 Worst pain 6/10 with L SL WB; 05/23/19 5/10 with weight bearing    Time 8    Period Weeks    Status On-going      PT LONG TERM GOAL #3   Title Patient will demonstrate full L ankle AROM to demonstrate PLOF and motion needed for normalized gait and stair mechanics    Baseline 07/18/19 DF 8d, PF 50d, Eversion 30d, Inversion 40d; 05/23/19 PF 30d DF -4d Inversion 33d Eversion: 26d    Time 8    Period Weeks    Status On-going      PT LONG TERM GOAL #4   Title Patient will demonstrate normalized gait mechanics without AD in order to return to PLOF    Baseline 06/2919 Reciprocal gait without AD and slightly decreased RLE step length and LLE stance time with increased eversion L foot and slight pain noted with L push off;  05/23/19 3 point with bilat crutches and boot decreased RLE step and LLE stance time    Time 8    Period Weeks    Status On-going      PT LONG TERM GOAL #5   Title Pt will increse R SLB to 45 seconds to indicate SLB equivalent to pt population age norms.    Baseline 07/18/19 R SLB 11 seconds    Time 4    Period Weeks      Additional Long Term Goals   Additional Long Term Goals Yes      PT LONG TERM GOAL #6   Title Pt will perform 17 L SL heel raises with full range to indicate LLE strength  equivalent  to RLE.    Baseline 07/18/19 pt unable to perform SL heel raise on L independently    Time 4    Period Weeks                 Plan - 07/20/19 1505    Clinical Impression Statement PT continued POC for RLE strengthing, balance and functional mobility with good success.  Pt with improved balance with SLB therex with intermittent use of unilat UE support and noted improvements in strength.  Pt performed all therex without use of brace, no increase in pain, and good carry over of verbal cues.  PT will continue progression as able.    Personal Factors and Comorbidities Age;Behavior Pattern;Comorbidity 1;Fitness;Profession    Examination-Activity Limitations Squat;Lift;Stairs;Stand;Locomotion Level;Transfers    Examination-Participation Restrictions Shop;Community Activity;Meal Prep;Yard Work    [employer]chant [employer] officer Evolving/Moderate complexity    Clinical Decision Making Moderate    Rehab Potential Good    PT Frequency 2x / week    PT Duration 8 weeks    PT Treatment/Interventions ADLs/Self Care Home Management;Electrical Stimulation;Therapeutic activities;Patient/family education;Joint Manipulations;Spinal Manipulations;Passive range of motion;Manual techniques;Dry needling;Functional mobility training;Cryotherapy;Ultrasound;Neuromuscular re-education;Balance training;Therapeutic exercise;Orthotic Fit/Training;DME Instruction;Iontophoresis 42m/ml Dexamethasone;Aquatic Therapy;Moist Heat;Gait training;Stair training;Traction    PT Next Visit Plan continue POC with SLS therex without brace    PT Home Exercise Plan calf stretch, AROM DF/PF, wt shifting, SL mini squat, SL deadlift with toe touch    Consulted and Agree with Plan of Care Patient           Patient will benefit from skilled therapeutic intervention in order to improve the following deficits and impairments:  Abnormal gait, Pain, Improper body mechanics, Increased fascial restricitons, Postural dysfunction,  Impaired tone, Decreased scar mobility, Decreased coordination, Decreased activity tolerance, Decreased endurance, Decreased range of motion, Decreased strength, Decreased balance, Difficulty walking, Impaired flexibility  Visit Diagnosis: Pain in left ankle and joints of left foot  Stiffness of left ankle, not elsewhere classified     Problem List There are no problems to display for this patient.  CDurwin RegesDPT LChinita Greenland SPT CDurwin Reges7/01/2019, 5:32 PM  [facility] [address] [place] [address]Phone: [phone]  Fax:  [phone] Name: Diane DISLAMRN: [id number] of Birth: [birth year]

## 2019-07-26 ENCOUNTER — Encounter: Payer: Self-pay | Admitting: Physical Therapy

## 2019-07-26 ENCOUNTER — Ambulatory Visit: Payer: Medicaid Other | Admitting: Physical Therapy

## 2019-07-26 ENCOUNTER — Other Ambulatory Visit: Payer: Self-pay

## 2019-07-26 DIAGNOSIS — M25672 Stiffness of left ankle, not elsewhere classified: Secondary | ICD-10-CM

## 2019-07-26 DIAGNOSIS — M25572 Pain in left ankle and joints of left foot: Secondary | ICD-10-CM

## 2019-07-26 NOTE — Therapy (Signed)
[facility] [address] [place], [address] Phone: [phone]   Fax:  [phone]  Physical Therapy Treatment  Patient Details  Name: Diane Turner MRN: [id number] Date of Birth: May 27, 2000 No data recorded  Encounter Date: 07/26/2019   PT End of Session - 07/26/19 1626    Visit Number 17    Number of Visits 21    Date for PT Re-Evaluation 08/18/19    PT Start Time 1619    PT Stop Time 1700    PT Time Calculation (min) 41 min    Activity Tolerance Patient tolerated treatment well;No increased pain    Behavior During Therapy WFL for tasks assessed/performed           Past Medical History:  Diagnosis Date  . Medical history non-contributory     Past Surgical History:  Procedure Laterality Date  . NO PAST SURGERIES    . ORIF ANKLE FRACTURE Left 03/23/2019   Procedure: OPEN REDUCTION INTERNAL FIXATION (ORIF) ANKLE FRACTURE BIMALLEOLAR LEFT;  Surgeon: Caroline More, DPM;  Location: [facility];  Service: Podiatry;  Laterality: Left;  CHOICE WITH PREOP POP/SAPH BLOCK    There were no vitals filed for this visit.   Subjective Assessment - 07/26/19 1622    Subjective P reports no pain today, but feels tired after doing a lot walking today for errands.  Pt reports doing a lot of walking over the weekend; no pain with walking until fatigue increases.  Pt reports walking yesterday without her brace and no increase in pain.  Compliance with HEP yesterday, unable to do over the weekend.    Pertinent History Pt is a 19 year old female presenting s/p bimalleolar displaced ankle fracture, with surgical repair 03/23/19 with cast removal on 05/03/19.Patient is completing exercises at home per surgeon and is currently wearing a boot, except for exercising, showering, and sleeping. Patient reports she is having a follow up 05/31/19, has been instructed to wear boot until this appointment. Patient is currently  ambulating with bilat crutches and boot, and what she estimates is 60% WB on LLE. Per notes in chart patient is able to 100% WB on LLE 05/24/19, but patient does not seem aware of this. 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 Next FU with Dr. Luana Shu is on June 7.    Limitations Lifting;Standing;Walking;House hold activities    How long can you sit comfortably? unlimited    How long can you stand comfortably? 76mns    How long can you walk comfortably? 573ms    Diagnostic tests Xrays    Patient Stated Goals Return to walk    Currently in Pain? No/denies    Pain Onset 1 to 4 weeks ago              TherEx -Recumbent bike 50m52mL3 for low level resistance -L SL heel raise 2x3 with increase in L ankle pain to 6/10 -L heel raise 2x7, 1x10 with RLE toe touch on floor and 1 finger support on treadmill bar for balance; reports 80% WB on L -DF from step 3x10 cuing for eccentric control with good carry over -SL mini squat with BUE finger touch on bar for balance 3x10; with cueing for hip hinge and glute activation -SLB 3x30 with 15 second hold without  UE support -Alternating reverse lunges 2x10 with cueing to increase BOS for improved balance with good carry over    PT Education - 07/26/19 1625    Education Details Therex form/technique    Person(s) Educated Patient    Methods Explanation;Demonstration;Verbal cues    Comprehension Verbalized understanding;Returned demonstration            PT Short Term Goals - 07/18/19 1431      PT SHORT TERM GOAL #1   Title Pt will be independent with HEP in order to decrease ankle pain and increase strength in order to improve pain-free function at home and work.    Baseline 07/18/19 Updated HEP 05/23/19    Time 4    Period Weeks    Status On-going    Target Date 08/15/19      PT  SHORT TERM GOAL #2   Title Patient will demonstrate full ankle PROM in order to demonstrate PLOF joint motion    Baseline 07/18/19 DF 15d, PF 70d, Eversion 35d, Inversion 45d; 05/23/19 PF 38d DF 0d Inv 42d    Time 4    Period Days    Status Partially Met    Target Date 08/15/19             PT Long Term Goals - 07/18/19 1434      PT LONG TERM GOAL #1   Title Patient will increase FOTO score to 72 to demonstrate predicted increase in functional mobility to complete ADLs    Baseline 07/18/19 FOTO 58; 05/23/19 40    Time 8    Period Weeks    Status On-going      PT LONG TERM GOAL #2   Title Pt will decrease worst pain as reported on NPRS by at least 3 points in order to demonstrate clinically significant reduction in ankle/foot pain.    Baseline 07/18/19 Worst pain 6/10 with L SL WB; 05/23/19 5/10 with weight bearing    Time 8    Period Weeks    Status On-going      PT LONG TERM GOAL #3   Title Patient will demonstrate full L ankle AROM to demonstrate PLOF and motion needed for normalized gait and stair mechanics    Baseline 07/18/19 DF 8d, PF 50d, Eversion 30d, Inversion 40d; 05/23/19 PF 30d DF -4d Inversion 33d Eversion: 26d    Time 8    Period Weeks    Status On-going      PT LONG TERM GOAL #4   Title Patient will demonstrate normalized gait mechanics without AD in order to return to PLOF    Baseline 06/2919 Reciprocal gait without AD and slightly decreased RLE step length and LLE stance time with increased eversion L foot and slight pain noted with L push off;  05/23/19 3 point with bilat crutches and boot decreased RLE step and LLE stance time    Time 8    Period Weeks    Status On-going      PT LONG TERM GOAL #5   Title Pt will increse R SLB to 45 seconds to indicate SLB equivalent to pt population age norms.    Baseline 07/18/19 R SLB 11 seconds    Time 4    Period Weeks      Additional Long Term Goals   Additional Long Term Goals Yes      PT LONG TERM GOAL #6   Title Pt will  perform 17 L SL heel raises with full range to indicate LLE  strength equivalent to RLE.    Baseline 07/18/19 pt unable to perform SL heel raise on L independently    Time 4    Period Weeks                 Plan - 07/26/19 1644    Clinical Impression Statement PT continued POC for LLE strengthing, balance, and functional mobility with good success.  Pt with improved SLB with intermittent use of ulinat UE support for light touch; noted increase in LLE strength with SL therex as well.  Pt performed all therex without use of brace and has increased walking distances without brace, with no increase in pain, only fatigue.  Pt able to complete L SL heel raise with reduced range and pain that relieves with cessation of activity.  PT will continue progression as able.    Personal Factors and Comorbidities Age;Behavior Pattern;Comorbidity 1;Fitness;Profession    Examination-Activity Limitations Squat;Lift;Stairs;Stand;Locomotion Level;Transfers    Examination-Participation Restrictions Shop;Community Activity;Meal Prep;Yard Work    [employer]chant [employer] officer Evolving/Moderate complexity    Clinical Decision Making Moderate    Rehab Potential Good    PT Frequency 2x / week    PT Duration 8 weeks    PT Treatment/Interventions ADLs/Self Care Home Management;Electrical Stimulation;Therapeutic activities;Patient/family education;Joint Manipulations;Spinal Manipulations;Passive range of motion;Manual techniques;Dry needling;Functional mobility training;Cryotherapy;Ultrasound;Neuromuscular re-education;Balance training;Therapeutic exercise;Orthotic Fit/Training;DME Instruction;Iontophoresis '4mg'$ /ml Dexamethasone;Aquatic Therapy;Moist Heat;Gait training;Stair training;Traction    PT Next Visit Plan continue POC with SLS therex without brace    PT Home Exercise Plan calf stretch, AROM DF/PF, wt shifting, SL mini squat, SL deadlift with toe touch    Consulted and Agree with Plan of Care Patient            Patient will benefit from skilled therapeutic intervention in order to improve the following deficits and impairments:  Abnormal gait, Pain, Improper body mechanics, Increased fascial restricitons, Postural dysfunction, Impaired tone, Decreased scar mobility, Decreased coordination, Decreased activity tolerance, Decreased endurance, Decreased range of motion, Decreased strength, Decreased balance, Difficulty walking, Impaired flexibility  Visit Diagnosis: Pain in left ankle and joints of left foot  Stiffness of left ankle, not elsewhere classified     Problem List There are no problems to display for this patient.  Diane Turner DPT Diane Turner, SPT Diane Turner 07/27/2019, 9:46 AM  [facility] [address] [place], [address] Phone: [phone]   Fax:  [phone]  Name: Diane Turner MRN: [id number] Date of Birth: 12-Feb-2000

## 2019-08-02 ENCOUNTER — Ambulatory Visit: Payer: Medicaid Other | Admitting: Physical Therapy

## 2019-08-02 ENCOUNTER — Other Ambulatory Visit: Payer: Self-pay

## 2019-08-02 ENCOUNTER — Encounter: Payer: Self-pay | Admitting: Physical Therapy

## 2019-08-02 DIAGNOSIS — M25672 Stiffness of left ankle, not elsewhere classified: Secondary | ICD-10-CM

## 2019-08-02 DIAGNOSIS — M25572 Pain in left ankle and joints of left foot: Secondary | ICD-10-CM | POA: Diagnosis not present

## 2019-08-02 NOTE — Therapy (Signed)
[facility] [address] [place], [address] Phone: [phone]   Fax:  [phone]  Physical Therapy Treatment  Patient Details  Name: Diane Turner MRN: [id number] Date of Birth: 17-Feb-2000 No data recorded  Encounter Date: 08/02/2019   PT End of Session - 08/02/19 1356    Visit Number 18    Number of Visits 21    Date for PT Re-Evaluation 08/18/19    PT Start Time 1350    PT Stop Time 1430    PT Time Calculation (min) 40 min    Activity Tolerance Patient tolerated treatment well;No increased pain    Behavior During Therapy WFL for tasks assessed/performed           Past Medical History:  Diagnosis Date  . Medical history non-contributory     Past Surgical History:  Procedure Laterality Date  . NO PAST SURGERIES    . ORIF ANKLE FRACTURE Left 03/23/2019   Procedure: OPEN REDUCTION INTERNAL FIXATION (ORIF) ANKLE FRACTURE BIMALLEOLAR LEFT;  Surgeon: Caroline More, DPM;  Location: [facility];  Service: Podiatry;  Laterality: Left;  CHOICE WITH PREOP POP/SAPH BLOCK    There were no vitals filed for this visit.   Subjective Assessment - 08/02/19 1354    Subjective Pt reports no pain today. Compliance with HEP and reports walking without brace at home.  Pt reports using the brace for outdoor walking activities over the weekend.    Pertinent History Pt is a 19 year old female presenting s/p bimalleolar displaced ankle fracture, with surgical repair 03/23/19 with cast removal on 05/03/19.Patient is completing exercises at home per surgeon and is currently wearing a boot, except for exercising, showering, and sleeping. Patient reports she is having a follow up 05/31/19, has been instructed to wear boot until this appointment. Patient is currently ambulating with bilat crutches and boot, and what she estimates is 60% WB on LLE. Per notes in chart patient is able to 100% WB on LLE 05/24/19, but patient does  not seem aware of this. 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 Next FU with Dr. Luana Shu is on June 7.    Limitations Lifting;Standing;Walking;House hold activities    How long can you sit comfortably? unlimited    How long can you stand comfortably? 14mns    How long can you walk comfortably? 527ms    Diagnostic tests Xrays    Patient Stated Goals Return to walk    Currently in Pain? No/denies    Pain Onset 1 to 4 weeks ago           TherEx -Recumbent bike 26m69mL3 for low level resistance -L SLS endurance x30 seconds, able to hold without UE support for 15 sec -L SLS endurance on foam 2x30 seconds with intermittent UE support and no increase in pain -L SL heel raise 2x10 pt able to perform in limited ROM with 5/10 pain that relieves with cessation -DF from step 3x10 cuing for eccentric control with good carry over -SL mini squat with BUE finger touch on bar for balance x8; with cueing for reduced UE support on bar for balance only -SL mini squat 2x8 with intermittent UE support for balance, cueing for hip hinge with good carry over -L tandem standing on foam with weighted ball  2kg toss on trampoline x10, with no LOB -L SLB on foam with weighted 2kg ball toss to trampoline x10, no LOB and intermittent R toe touch for balance    PT Education - 08/02/19 1355    Education Details Therex form/technique    Person(s) Educated Patient    Methods Explanation;Demonstration;Verbal cues    Comprehension Verbalized understanding;Returned demonstration            PT Short Term Goals - 07/18/19 1431      PT SHORT TERM GOAL #1   Title Pt will be independent with HEP in order to decrease ankle pain and increase strength in order to improve pain-free function at home and work.    Baseline 07/18/19 Updated HEP 05/23/19    Time  4    Period Weeks    Status On-going    Target Date 08/15/19      PT SHORT TERM GOAL #2   Title Patient will demonstrate full ankle PROM in order to demonstrate PLOF joint motion    Baseline 07/18/19 DF 15d, PF 70d, Eversion 35d, Inversion 45d; 05/23/19 PF 38d DF 0d Inv 42d    Time 4    Period Days    Status Partially Met    Target Date 08/15/19             PT Long Term Goals - 07/18/19 1434      PT LONG TERM GOAL #1   Title Patient will increase FOTO score to 72 to demonstrate predicted increase in functional mobility to complete ADLs    Baseline 07/18/19 FOTO 58; 05/23/19 40    Time 8    Period Weeks    Status On-going      PT LONG TERM GOAL #2   Title Pt will decrease worst pain as reported on NPRS by at least 3 points in order to demonstrate clinically significant reduction in ankle/foot pain.    Baseline 07/18/19 Worst pain 6/10 with L SL WB; 05/23/19 5/10 with weight bearing    Time 8    Period Weeks    Status On-going      PT LONG TERM GOAL #3   Title Patient will demonstrate full L ankle AROM to demonstrate PLOF and motion needed for normalized gait and stair mechanics    Baseline 07/18/19 DF 8d, PF 50d, Eversion 30d, Inversion 40d; 05/23/19 PF 30d DF -4d Inversion 33d Eversion: 26d    Time 8    Period Weeks    Status On-going      PT LONG TERM GOAL #4   Title Patient will demonstrate normalized gait mechanics without AD in order to return to PLOF    Baseline 06/2919 Reciprocal gait without AD and slightly decreased RLE step length and LLE stance time with increased eversion L foot and slight pain noted with L push off;  05/23/19 3 point with bilat crutches and boot decreased RLE step and LLE stance time    Time 8    Period Weeks    Status On-going      PT LONG TERM GOAL #5   Title Pt will increse R SLB to 45 seconds to indicate SLB equivalent to pt population age norms.    Baseline 07/18/19 R SLB 11 seconds    Time 4    Period Weeks      Additional Long Term Goals    Additional Long Term Goals Yes      PT LONG TERM GOAL #6   Title Pt will perform  17 L SL heel raises with full range to indicate LLE strength equivalent to RLE.    Baseline 07/18/19 pt unable to perform SL heel raise on L independently    Time 4    Period Weeks                 Plan - 08/02/19 1444    Clinical Impression Statement PT continued POC for LLE strengthening, balance, and functional mobility with good success.  Pt with improved L SLB of 15 seconds before needing UE support for balance.  Pt able to perform multiple sets of L SL heel raises with limited range and a mild increase in ankle pain that resolves with cessation of activity.  Pt performed all exercises without use of brace and good carry over of verbal cues.  PT wil continue progression as able.    Personal Factors and Comorbidities Age;Behavior Pattern;Comorbidity 1;Fitness;Profession    Examination-Activity Limitations Squat;Lift;Stairs;Stand;Locomotion Level;Transfers    Examination-Participation Restrictions Shop;Community Activity;Meal Prep;Yard Work    [employer]chant [employer] officer Evolving/Moderate complexity    Clinical Decision Making Moderate    Rehab Potential Good    PT Frequency 2x / week    PT Duration 8 weeks    PT Treatment/Interventions ADLs/Self Care Home Management;Electrical Stimulation;Therapeutic activities;Patient/family education;Joint Manipulations;Spinal Manipulations;Passive range of motion;Manual techniques;Dry needling;Functional mobility training;Cryotherapy;Ultrasound;Neuromuscular re-education;Balance training;Therapeutic exercise;Orthotic Fit/Training;DME Instruction;Iontophoresis 88m/ml Dexamethasone;Aquatic Therapy;Moist Heat;Gait training;Stair training;Traction    PT Next Visit Plan continue POC with SLS therex without brace, star balance, back pedal    PT Home Exercise Plan calf stretch, AROM DF/PF, wt shifting, SL mini squat, SL deadlift with toe touch    Consulted and Agree  with Plan of Care Patient           Patient will benefit from skilled therapeutic intervention in order to improve the following deficits and impairments:  Abnormal gait, Pain, Improper body mechanics, Increased fascial restricitons, Postural dysfunction, Impaired tone, Decreased scar mobility, Decreased coordination, Decreased activity tolerance, Decreased endurance, Decreased range of motion, Decreased strength, Decreased balance, Difficulty walking, Impaired flexibility  Visit Diagnosis: Pain in left ankle and joints of left foot  Stiffness of left ankle, not elsewhere classified     Problem List There are no problems to display for this patient.  CDurwin RegesDPT LChinita Greenland SPT CDurwin Reges7/14/2021, 4:03 PM  [facility] [address] [place] [address]Phone: [phone]  Fax:  [phone] Name: Diane MADEJMRN: [id number] of Birth: 108/20/2002

## 2019-08-09 ENCOUNTER — Ambulatory Visit: Payer: Medicaid Other | Admitting: Physical Therapy

## 2019-08-09 ENCOUNTER — Other Ambulatory Visit: Payer: Self-pay

## 2019-08-09 ENCOUNTER — Encounter: Payer: Self-pay | Admitting: Physical Therapy

## 2019-08-09 DIAGNOSIS — M25572 Pain in left ankle and joints of left foot: Secondary | ICD-10-CM | POA: Diagnosis not present

## 2019-08-09 DIAGNOSIS — M25672 Stiffness of left ankle, not elsewhere classified: Secondary | ICD-10-CM

## 2019-08-09 NOTE — Therapy (Signed)
[facility] [address] [place], [address] Phone: [phone]   Fax:  [phone]  Physical Therapy Treatment  Patient Details  Name: Diane Turner MRN: [id number] Date of Birth: 01-Dec-2000 No data recorded  Encounter Date: 08/09/2019    Past Medical History:  Diagnosis Date  . Medical history non-contributory     Past Surgical History:  Procedure Laterality Date  . NO PAST SURGERIES    . ORIF ANKLE FRACTURE Left 03/23/2019   Procedure: OPEN REDUCTION INTERNAL FIXATION (ORIF) ANKLE FRACTURE BIMALLEOLAR LEFT;  Surgeon: Caroline More, DPM;  Location: [facility];  Service: Podiatry;  Laterality: Left;  CHOICE WITH PREOP POP/SAPH BLOCK    There were no vitals filed for this visit.    TherEx -Recumbent bike 61mn L3 for low level resistance - Gastroc stretch on step 2x 30sec - Eccentric heel raise off step 2x 10 with cuing for pain free range and for eccentric control with good carry over- pain during second set - Squat x10; on bosu (hardside) 2x 10 with no pain with closed chain exercises, with difficulty with stability on bosu ball - L SLS on foam drawing alphabet with large reaching with occasional foot down   Manual STM with trigger point release to flexor digitorum near calcaneal insertion, with patient reporting pain is more posterior, localizing to achilles tendon STM with trigger point release to gastroc with some concordant sign Cross friction massage to achilles tendon and ice massage                          PT Short Term Goals - 07/18/19 1431      PT SHORT TERM GOAL #1   Title Pt will be independent with HEP in order to decrease ankle pain and increase strength in order to improve pain-free function at home and work.    Baseline 07/18/19 Updated HEP 05/23/19    Time 4    Period Weeks    Status On-going    Target Date 08/15/19      PT SHORT TERM GOAL #2    Title Patient will demonstrate full ankle PROM in order to demonstrate PLOF joint motion    Baseline 07/18/19 DF 15d, PF 70d, Eversion 35d, Inversion 45d; 05/23/19 PF 38d DF 0d Inv 42d    Time 4    Period Days    Status Partially Met    Target Date 08/15/19             PT Long Term Goals - 07/18/19 1434      PT LONG TERM GOAL #1   Title Patient will increase FOTO score to 72 to demonstrate predicted increase in functional mobility to complete ADLs    Baseline 07/18/19 FOTO 58; 05/23/19 40    Time 8    Period Weeks    Status On-going      PT LONG TERM GOAL #2   Title Pt will decrease worst pain as reported on NPRS by at least 3 points in order to demonstrate clinically significant reduction in ankle/foot pain.    Baseline 07/18/19 Worst pain 6/10 with L SL WB; 05/23/19 5/10 with weight bearing    Time 8    Period Weeks    Status On-going      PT LONG TERM GOAL #3   Title Patient will demonstrate full L ankle AROM to demonstrate PLOF and motion needed for normalized gait and stair  mechanics    Baseline 07/18/19 DF 8d, PF 50d, Eversion 30d, Inversion 40d; 05/23/19 PF 30d DF -4d Inversion 33d Eversion: 26d    Time 8    Period Weeks    Status On-going      PT LONG TERM GOAL #4   Title Patient will demonstrate normalized gait mechanics without AD in order to return to PLOF    Baseline 06/2919 Reciprocal gait without AD and slightly decreased RLE step length and LLE stance time with increased eversion L foot and slight pain noted with L push off;  05/23/19 3 point with bilat crutches and boot decreased RLE step and LLE stance time    Time 8    Period Weeks    Status On-going      PT LONG TERM GOAL #5   Title Pt will increse R SLB to 45 seconds to indicate SLB equivalent to pt population age norms.    Baseline 07/18/19 R SLB 11 seconds    Time 4    Period Weeks      Additional Long Term Goals   Additional Long Term Goals Yes      PT LONG TERM GOAL #6   Title Pt will perform 17 L SL heel  raises with full range to indicate LLE strength equivalent to RLE.    Baseline 07/18/19 pt unable to perform SL heel raise on L independently    Time 4    Period Weeks                  Patient will benefit from skilled therapeutic intervention in order to improve the following deficits and impairments:     Visit Diagnosis: No diagnosis found.     Problem List There are no problems to display for this patient.  Durwin Reges DPT Durwin Reges 08/09/2019, 4:25 PM  [facility] [address], [place], [address] Phone: [phone]   Fax:  [phone]  Name: Diane Turner MRN: [id number] Date of Birth: [birth year]

## 2019-08-16 ENCOUNTER — Other Ambulatory Visit: Payer: Self-pay

## 2019-08-16 ENCOUNTER — Ambulatory Visit: Payer: Medicaid Other | Admitting: Physical Therapy

## 2019-08-16 ENCOUNTER — Encounter: Payer: Self-pay | Admitting: Physical Therapy

## 2019-08-16 DIAGNOSIS — M25572 Pain in left ankle and joints of left foot: Secondary | ICD-10-CM | POA: Diagnosis not present

## 2019-08-16 DIAGNOSIS — M25672 Stiffness of left ankle, not elsewhere classified: Secondary | ICD-10-CM

## 2019-08-16 NOTE — Therapy (Signed)
[facility] [address], [place], [address] Phone: [phone]   Fax:  [phone]  Physical Therapy Treatment  Patient Details  Name: Diane Turner MRN: [id number] Date of Birth: [birth year] No data recorded  Encounter Date: 08/16/2019   PT End of Session - 08/16/19 1039    Visit Number 20    Number of Visits 21    Date for PT Re-Evaluation 08/18/19    PT Start Time 4970    PT Stop Time 1115    PT Time Calculation (min) 40 min    Activity Tolerance Patient tolerated treatment well;No increased pain    Behavior During Therapy WFL for tasks assessed/performed           Past Medical History:  Diagnosis Date  . Medical history non-contributory     Past Surgical History:  Procedure Laterality Date  . NO PAST SURGERIES    . ORIF ANKLE FRACTURE Left 03/23/2019   Procedure: OPEN REDUCTION INTERNAL FIXATION (ORIF) ANKLE FRACTURE BIMALLEOLAR LEFT;  Surgeon: Caroline More, DPM;  Location: [facility];  Service: Podiatry;  Laterality: Left;  CHOICE WITH PREOP POP/SAPH BLOCK    There were no vitals filed for this visit.   Subjective Assessment - 08/16/19 1036    Subjective Pt reports no pain today.  Compliance with HEP, has been doing less exercises than before d/t pain last week; this week has been less painful with HEP.  SL heel raises cause the most pain 6-7/10.    Pertinent History Pt is a 19 year old female presenting s/p bimalleolar displaced ankle fracture, with surgical repair 03/23/19 with cast removal on 05/03/19.Patient is completing exercises at home per surgeon and is currently wearing a boot, except for exercising, showering, and sleeping. Patient reports she is having a follow up 05/31/19, has been instructed to wear boot until this appointment. Patient is currently ambulating with bilat crutches and boot, and what she estimates is 60% WB on LLE. Per notes in chart patient is able to 100% WB on  LLE 05/24/19, but patient does not seem aware of this. 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 Next FU with Dr. Luana Shu is on June 7.    Limitations Lifting;Standing;Walking;House hold activities    How long can you sit comfortably? unlimited    How long can you stand comfortably? 33mns    How long can you walk comfortably? 575ms    Currently in Pain? No/denies    Pain Onset 1 to 4 weeks ago           TherEx -Alternating calf stretch and heel raise off step, 3x10 with 3 sec stretch hold -Squat x8 with cueing for hip hinge -SL squat 2x8 with cueing for hip hinge and reduction of knee flexion over toes; tactile cueing for upright posture to reduce L lateral trunk leaning -SLS on foam with 3 point star balance 3x30 sec with cueing to reduce range for decreased L medial forefoot pain -Reverse lunge 2x10 with improved balance and no increase in L ankle/foot pain -Alternating lateral lunge 2x10 with cueing for increased hip hinge to reduce knee flexion over toes on RLE, and cueing for increased push off to return to stand -Calf stretch on small ramp x1 minute     PT Education - 08/16/19  1039    Education Details Therex form/mechanics    Person(s) Educated Patient    Methods Explanation;Demonstration;Verbal cues    Comprehension Verbalized understanding;Returned demonstration;Verbal cues required            PT Short Term Goals - 07/18/19 1431      PT SHORT TERM GOAL #1   Title Pt will be independent with HEP in order to decrease ankle pain and increase strength in order to improve pain-free function at home and work.    Baseline 07/18/19 Updated HEP 05/23/19    Time 4    Period Weeks    Status On-going    Target Date 08/15/19      PT SHORT TERM GOAL #2   Title Patient will demonstrate full ankle PROM in order to  demonstrate PLOF joint motion    Baseline 07/18/19 DF 15d, PF 70d, Eversion 35d, Inversion 45d; 05/23/19 PF 38d DF 0d Inv 42d    Time 4    Period Days    Status Partially Met    Target Date 08/15/19             PT Long Term Goals - 07/18/19 1434      PT LONG TERM GOAL #1   Title Patient will increase FOTO score to 72 to demonstrate predicted increase in functional mobility to complete ADLs    Baseline 07/18/19 FOTO 58; 05/23/19 40    Time 8    Period Weeks    Status On-going      PT LONG TERM GOAL #2   Title Pt will decrease worst pain as reported on NPRS by at least 3 points in order to demonstrate clinically significant reduction in ankle/foot pain.    Baseline 07/18/19 Worst pain 6/10 with L SL WB; 05/23/19 5/10 with weight bearing    Time 8    Period Weeks    Status On-going      PT LONG TERM GOAL #3   Title Patient will demonstrate full L ankle AROM to demonstrate PLOF and motion needed for normalized gait and stair mechanics    Baseline 07/18/19 DF 8d, PF 50d, Eversion 30d, Inversion 40d; 05/23/19 PF 30d DF -4d Inversion 33d Eversion: 26d    Time 8    Period Weeks    Status On-going      PT LONG TERM GOAL #4   Title Patient will demonstrate normalized gait mechanics without AD in order to return to PLOF    Baseline 06/2919 Reciprocal gait without AD and slightly decreased RLE step length and LLE stance time with increased eversion L foot and slight pain noted with L push off;  05/23/19 3 point with bilat crutches and boot decreased RLE step and LLE stance time    Time 8    Period Weeks    Status On-going      PT LONG TERM GOAL #5   Title Pt will increse R SLB to 45 seconds to indicate SLB equivalent to pt population age norms.    Baseline 07/18/19 R SLB 11 seconds    Time 4    Period Weeks      Additional Long Term Goals   Additional Long Term Goals Yes      PT LONG TERM GOAL #6   Title Pt will perform 17 L SL heel raises with full range to indicate LLE strength equivalent  to RLE.    Baseline 07/18/19 pt unable to perform SL heel raise on L independently    Time  4    Period Weeks                 Plan - 08/16/19 1108    Clinical Impression Statement PT continued POC for ankle strengthening, mobility and balance.  Pt with increased pain with SL activities that alleviates with cessation of activity; no increased pain with BLE exercises.  Pt educated on using BLE exercises for high pain days and gradually working to [employer] activities as pain allows.  PT will continue progression as able.    Personal Factors and Comorbidities Age;Behavior Pattern;Comorbidity 1;Fitness;Profession    Comorbidities overweight    Examination-Activity Limitations Squat;Lift;Stairs;Stand;Locomotion Level;Transfers    Examination-Participation Restrictions Shop;Community Activity;Meal Prep;Yard Work    [employer]chant [employer] officer Evolving/Moderate complexity    Clinical Decision Making Moderate    Rehab Potential Good    PT Frequency 2x / week    PT Duration 8 weeks    PT Treatment/Interventions ADLs/Self Care Home Management;Electrical Stimulation;Therapeutic activities;Patient/family education;Joint Manipulations;Spinal Manipulations;Passive range of motion;Manual techniques;Dry needling;Functional mobility training;Cryotherapy;Ultrasound;Neuromuscular re-education;Balance training;Therapeutic exercise;Orthotic Fit/Training;DME Instruction;Iontophoresis '4mg'$ /ml Dexamethasone;Aquatic Therapy;Moist Heat;Gait training;Stair training;Traction    PT Next Visit Plan continue POC with SLS therex without brace, star balance, back pedal    PT Home Exercise Plan calf stretch, AROM DF/PF, wt shifting, SL mini squat, SL deadlift with toe touch    Consulted and Agree with Plan of Care Patient           Patient will benefit from skilled therapeutic intervention in order to improve the following deficits and impairments:  Abnormal gait, Pain, Improper body mechanics, Increased fascial  restricitons, Postural dysfunction, Impaired tone, Decreased scar mobility, Decreased coordination, Decreased activity tolerance, Decreased endurance, Decreased range of motion, Decreased strength, Decreased balance, Difficulty walking, Impaired flexibility  Visit Diagnosis: Pain in left ankle and joints of left foot  Stiffness of left ankle, not elsewhere classified     Problem List There are no problems to display for this patient.  Durwin Reges DPT Chinita Greenland, SPT Durwin Reges 08/16/2019, 2:21 PM  [facility] [address], [place], [address] Phone: [phone]   Fax:  [phone]  Name: Diane Turner MRN: [id number] Date of Birth: Dec 16, 2000

## 2020-05-17 IMAGING — XA DG ANKLE 2V *L*
13 of 24 series · 13 of 24 positions shown · non-contrast
Comparison: March 11, 2019.

CLINICAL DATA: Open reduction and internal fixation of left ankle
fracture.

EXAM:
LEFT ANKLE - 2 VIEW; DG C-ARM 1-60 MIN
Radiation exposure index: 0.63 mGy.

[Series 1: run · 1 of 1 slices shown (1 of 13)]
[im 1/1]
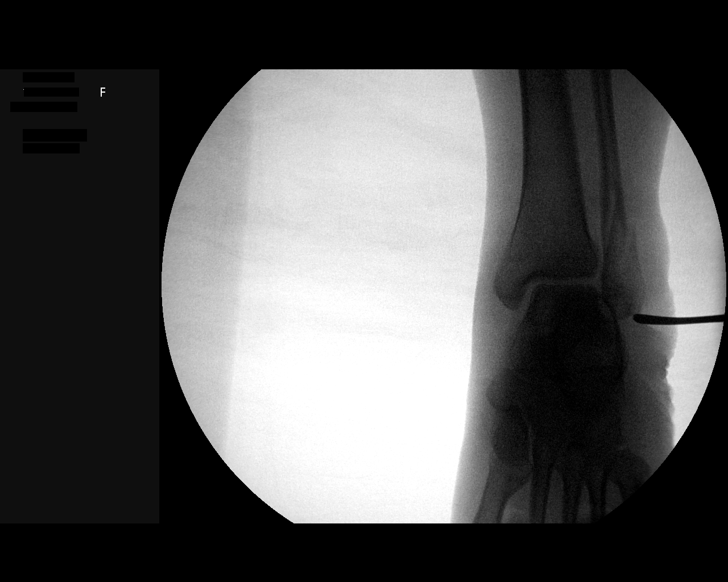

[Series 3: run · 1 of 1 slices shown (2 of 13)]
[im 1/1]
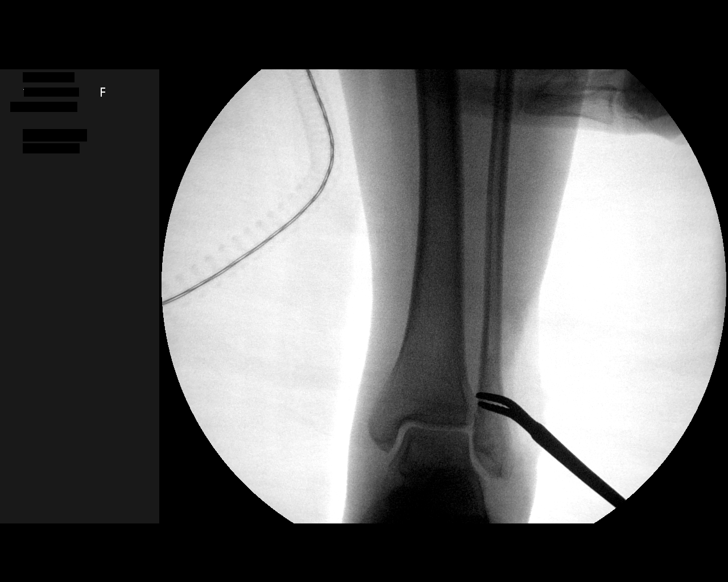

[Series 5: run · 1 of 1 slices shown (3 of 13)]
[im 1/1]
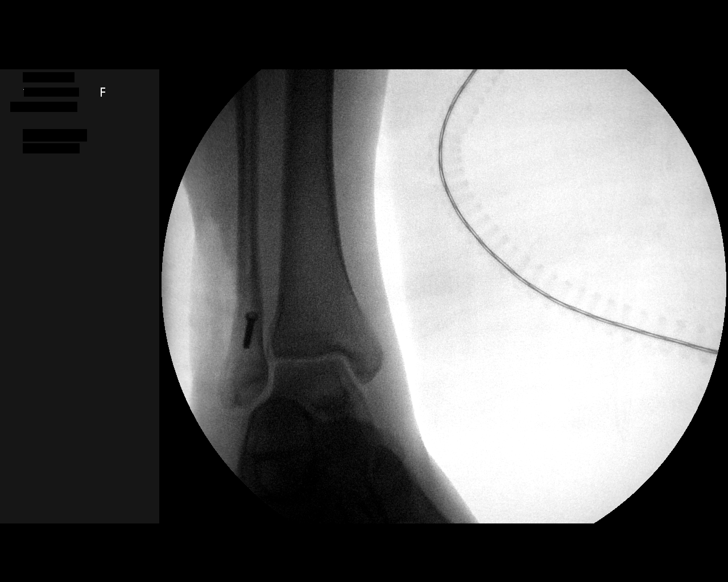

[Series 7: run · 1 of 1 slices shown (4 of 13)]
[im 1/1]
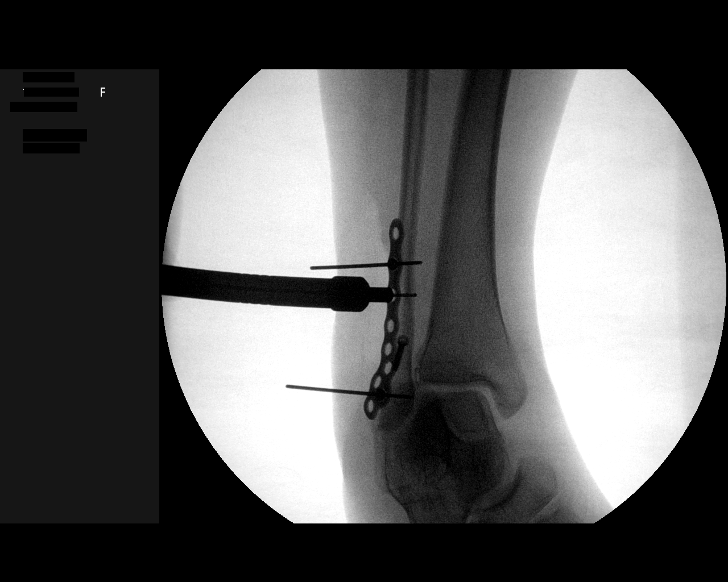

[Series 9: run · 1 of 1 slices shown (5 of 13)]
[im 1/1]
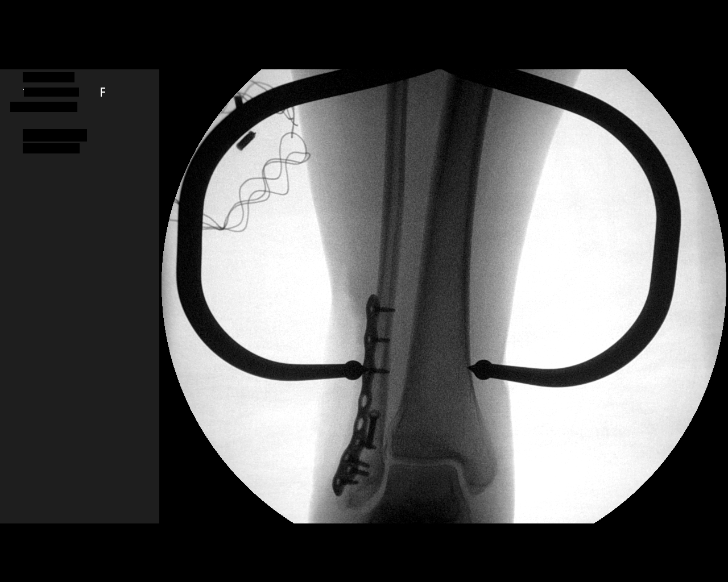

[Series 11: run · 1 of 1 slices shown (6 of 13)]
[im 1/1]
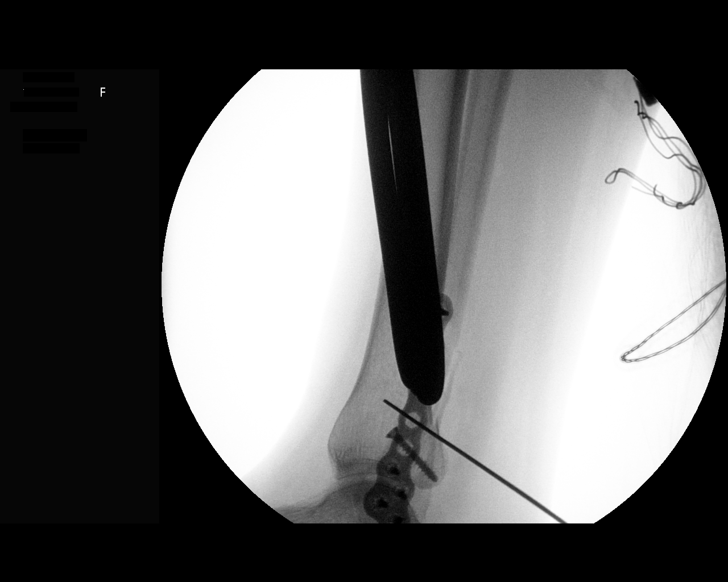

[Series 13: run · 1 of 1 slices shown (7 of 13)]
[im 1/1]
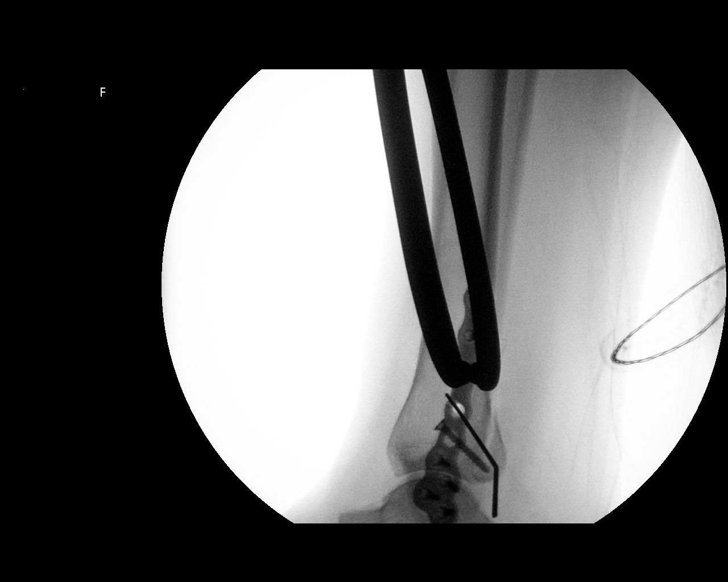

[Series 14: run · 1 of 1 slices shown (8 of 13)]
[im 1/1]
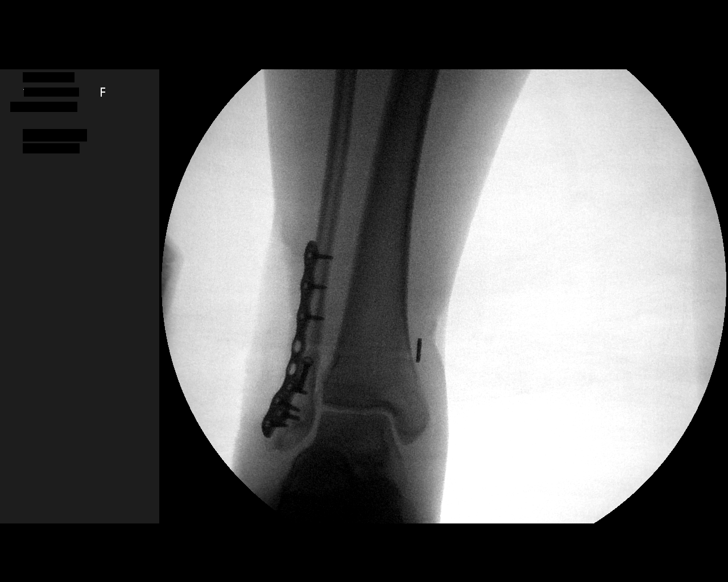

[Series 16: run · 1 of 1 slices shown (9 of 13)]
[im 1/1]
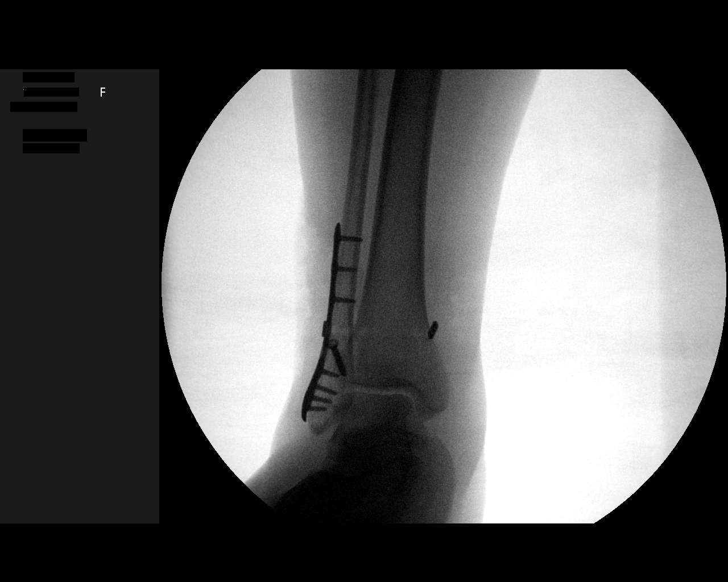

[Series 18: run · 1 of 1 slices shown (10 of 13)]
[im 1/1]
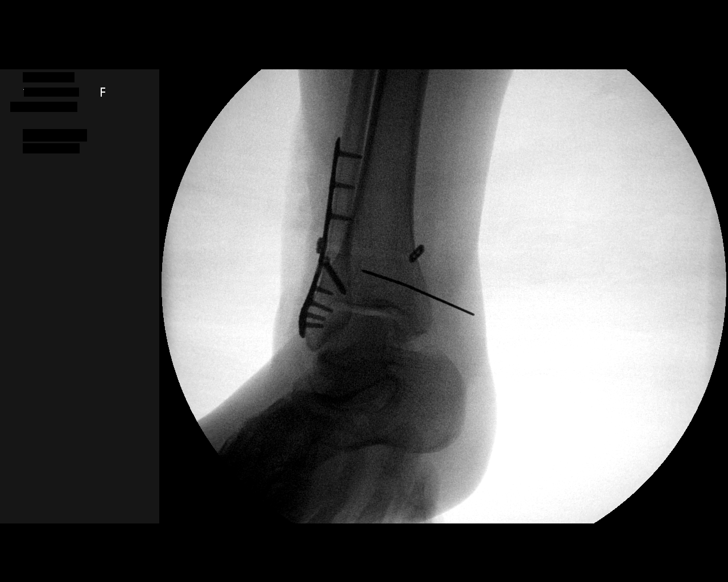

[Series 20: run · 1 of 1 slices shown (11 of 13)]
[im 1/1]
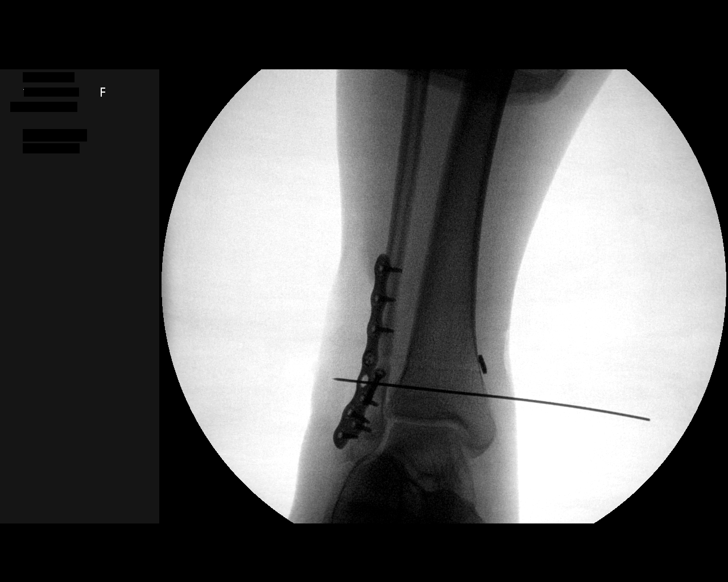

[Series 22: run · 1 of 1 slices shown (12 of 13)]
[im 1/1]
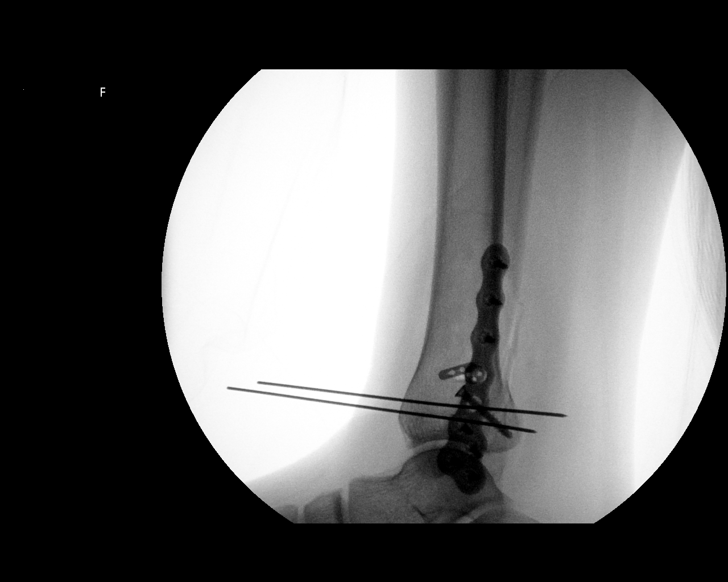

[Series 24: run · 1 of 1 slices shown (13 of 13)]
[im 1/1]
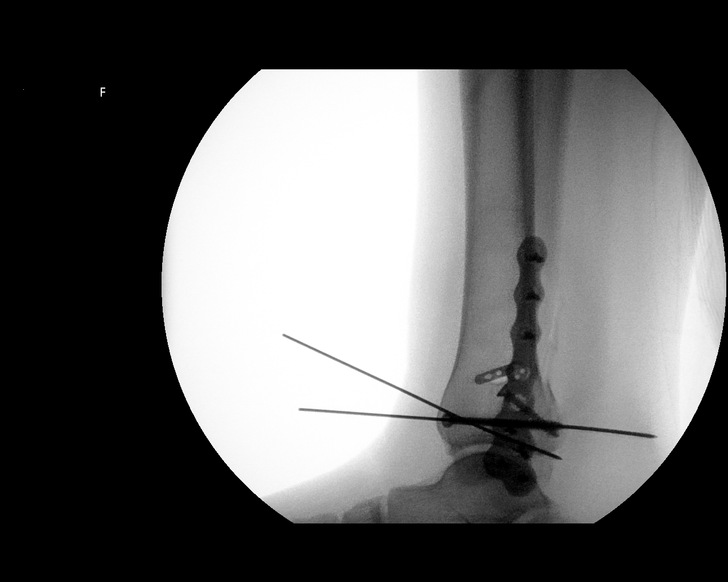

[13 of 24 positions shown; findings below may reference images not displayed]

FINDINGS: Seventeen intraoperative fluoroscopic images were obtained of the
left ankle. These images demonstrate the patient be status post
surgical internal fixation of distal left fibular fracture as well
as distal tibial fracture. Good alignment of fracture components is
noted.
IMPRESSION: Status post surgical internal fixation of distal left fibular and
tibial fractures.

## 2020-11-23 ENCOUNTER — Other Ambulatory Visit: Payer: Self-pay

## 2020-11-23 ENCOUNTER — Emergency Department: Payer: Medicaid Other

## 2020-11-23 ENCOUNTER — Encounter: Payer: Self-pay | Admitting: Emergency Medicine

## 2020-11-23 DIAGNOSIS — R112 Nausea with vomiting, unspecified: Secondary | ICD-10-CM | POA: Diagnosis not present

## 2020-11-23 DIAGNOSIS — R1010 Upper abdominal pain, unspecified: Secondary | ICD-10-CM | POA: Diagnosis not present

## 2020-11-23 DIAGNOSIS — R0789 Other chest pain: Secondary | ICD-10-CM | POA: Insufficient documentation

## 2020-11-23 LAB — BASIC METABOLIC PANEL
Anion gap: 7 (ref 5–15)
BUN: 13 mg/dL (ref 6–20)
CO2: 26 mmol/L (ref 22–32)
Calcium: 9 mg/dL (ref 8.9–10.3)
Chloride: 108 mmol/L (ref 98–111)
Creatinine, Ser: 0.88 mg/dL (ref 0.44–1.00)
GFR, Estimated: >60 mL/min (ref >60)
Glucose, Bld: 111 mg/dL — ABNORMAL HIGH (ref 70–99)
Potassium: 3.5 mmol/L (ref 3.5–5.1)
Sodium: 141 mmol/L (ref 135–145)

## 2020-11-23 LAB — CBC
HCT: 34.9 % — ABNORMAL LOW (ref 36.0–46.0)
Hemoglobin: 11.7 g/dL — ABNORMAL LOW (ref 12.0–15.0)
MCH: 28.5 pg (ref 26.0–34.0)
MCHC: 33.5 g/dL (ref 30.0–36.0)
MCV: 84.9 fL (ref 80.0–100.0)
Platelets: 231 10*3/uL (ref 150–400)
RBC: 4.11 MIL/uL (ref 3.87–5.11)
RDW: 12.8 % (ref 11.5–15.5)
WBC: 8 10*3/uL (ref 4.0–10.5)
nRBC: 0 % (ref 0.0–0.2)

## 2020-11-23 LAB — TROPONIN I (HIGH SENSITIVITY): Troponin I (High Sensitivity): 3 ng/L (ref <18)

## 2020-11-23 MED ORDER — IBUPROFEN 400 MG PO TABS
400.0000 mg | ORAL_TABLET | Freq: Once | ORAL | Status: AC | PRN
  Administered 2020-11-23: 400 mg via ORAL
  Filled 2020-11-23: qty 1

## 2020-11-23 NOTE — ED Triage Notes (Signed)
Pt presents to ER from home with complains of chest pressure to mid sternum, pte reports she was just sitting at home denies any injury to area. Pt talks in complete sentences no respiratory distress noted

## 2020-11-24 ENCOUNTER — Emergency Department
Admission: EM | Admit: 2020-11-24 | Discharge: 2020-11-24 | Disposition: A | Discharge status: Home / Self Care | Payer: Medicaid Other | Attending: Emergency Medicine | Admitting: Emergency Medicine

## 2020-11-24 DIAGNOSIS — K30 Functional dyspepsia: Secondary | ICD-10-CM

## 2020-11-24 DIAGNOSIS — R079 Chest pain, unspecified: Secondary | ICD-10-CM

## 2020-11-24 LAB — TROPONIN I (HIGH SENSITIVITY): Troponin I (High Sensitivity): 3 ng/L (ref <18)

## 2020-11-24 MED ORDER — CALCIUM CARBONATE ANTACID 500 MG PO CHEW: 1.0000 | CHEWABLE_TABLET | Freq: Every day | ORAL | 3 refills | Status: AC

## 2020-11-24 MED ORDER — FAMOTIDINE 20 MG PO TABS: 20.0000 mg | ORAL_TABLET | Freq: Two times a day (BID) | ORAL | 1 refills | Status: DC

## 2020-11-24 NOTE — ED Provider Notes (Signed)
[facility] Emergency Department Provider Note  ____________________________________________  Time seen: Approximately 5:57 AM  I have reviewed the triage vital signs and the nursing notes.   HISTORY  Chief Complaint Chest Pain   HPI Diane Turner is a 20 y.o. female no significant past medical history who presents for evaluation of chest pain.  Patient reports that she was sitting down watching TV when all of a sudden she developed a severe pressure in the center of her chest radiating straight to her back and down to her abdomen.  She felt nauseous and vomited 1 time.  After that the pain improved but it lasted a couple of hours and to be fully resolved.  She reports having 1 similar episode in the past but it was not as severe.  She reports that this happened after she ate pizza.  She denies any shortness of breath associated with it.  She denies cough or fever.  She denies any personal or family history of heart disease, she is not a smoker, no history of PE or DVT, no recent travel immobilization, no leg pain or swelling, no hemoptysis, no exogenous hormones.  Patient reports that she has been pain-free now for 6 hours.   Past Medical History:  Diagnosis Date   Medical history non-contributory     There are no problems to display for this patient.   Past Surgical History:  Procedure Laterality Date   NO PAST SURGERIES     ORIF ANKLE FRACTURE Left 03/23/2019   Procedure: OPEN REDUCTION INTERNAL FIXATION (ORIF) ANKLE FRACTURE BIMALLEOLAR LEFT;  Surgeon: Rosetta Posner, DPM;  Location: [facility];  Service: Podiatry;  Laterality: Left;  CHOICE WITH PREOP POP/SAPH BLOCK    Prior to Admission medications   Medication Sig Start Date End Date Taking? Authorizing Provider  calcium carbonate (TUMS) 500 MG chewable tablet Chew 1 tablet (200 mg of elemental calcium total) by mouth daily. 11/24/20 11/24/21 Yes Amen Dargis, [place], MD  famotidine (PEPCID)  20 MG tablet Take 1 tablet (20 mg total) by mouth 2 (two) times daily. 11/24/20 11/24/21 Yes Kameryn Davern, [place], MD  acetaminophen (TYLENOL) 325 MG tablet Take 650 mg by mouth every 6 (six) hours as needed.    [provider]    Allergies Patient has no known allergies.  No family history on file.  Social History Social History   Tobacco Use   Smoking status: Never   Smokeless tobacco: Never  Vaping Use   Vaping Use: Never used  Substance Use Topics   Alcohol use: Never    Review of Systems  Constitutional: Negative for fever. Eyes: Negative for visual changes. ENT: Negative for sore throat. Neck: No neck pain  Cardiovascular: + chest pain. Respiratory: Negative for shortness of breath. Gastrointestinal: Negative for abdominal pain or diarrhea. + N/V Genitourinary: Negative for dysuria. Musculoskeletal: Negative for back pain. Skin: Negative for rash. Neurological: Negative for headaches, weakness or numbness. Psych: No SI or HI  ____________________________________________   PHYSICAL EXAM:  VITAL SIGNS: ED Triage Vitals  Enc Vitals Group     BP 11/23/20 2234 115/78     Pulse Rate 11/23/20 2234 62     Resp 11/23/20 2234 16     Temp 11/23/20 2234 98.2 F (36.8 C)     Temp Source 11/23/20 2234 Oral     SpO2 11/23/20 2234 100 %     Weight 11/23/20 2237 165 lb (74.8 kg)     Height 11/23/20 2237 5\' 4"  (1.626  m)     Head Circumference --      Peak Flow --      Pain Score 11/23/20 2237 5     Pain Loc --      Pain Edu? --      Excl. in GC? --     Constitutional: Alert and oriented. Well appearing and in no apparent distress. HEENT:      Head: Normocephalic and atraumatic.         Eyes: Conjunctivae are normal. Sclera is non-icteric.       Mouth/Throat: Mucous membranes are moist.       Neck: Supple with no signs of meningismus. Cardiovascular: Regular rate and rhythm. No murmurs, gallops, or rubs. 2+ symmetrical distal pulses are present in all  extremities. No JVD. Respiratory: Normal respiratory effort. Lungs are clear to auscultation bilaterally.  Gastrointestinal: Soft, non tender, and non distended with positive bowel sounds. No rebound or guarding. Genitourinary: No CVA tenderness. Musculoskeletal:  No edema, cyanosis, or erythema of extremities. Neurologic: Normal speech and language. Face is symmetric. Moving all extremities. No gross focal neurologic deficits are appreciated. Skin: Skin is warm, dry and intact. No rash noted. Psychiatric: Mood and affect are normal. Speech and behavior are normal.  ____________________________________________   LABS (all labs ordered are listed, but only abnormal results are displayed)  Labs Reviewed  BASIC METABOLIC PANEL - Abnormal; Notable for the following components:      Result Value   Glucose, Bld 111 (*)    All other components within normal limits  CBC - Abnormal; Notable for the following components:   Hemoglobin 11.7 (*)    HCT 34.9 (*)    All other components within normal limits  POC URINE PREG, ED  TROPONIN I (HIGH SENSITIVITY)  TROPONIN I (HIGH SENSITIVITY)   ____________________________________________  EKG  ED ECG REPORT I, Nita Sickle, the attending physician, personally viewed and interpreted this ECG.  Sinus rhythm with a rate of 69, normal intervals, normal axis, T wave inversion in lead III with no ST elevations or depressions.  No prior for comparison. ____________________________________________  RADIOLOGY  I have personally reviewed the images performed during this visit and I agree with the Radiologist's read.   Interpretation by Radiologist:  DG Chest 2 View  Result Date: 11/23/2020 CLINICAL DATA:  Chest pain EXAM: CHEST - 2 VIEW COMPARISON:  None. FINDINGS: The heart size and mediastinal contours are within normal limits. No focal consolidation. No pleural effusion. No pneumothorax. The visualized skeletal structures are unremarkable.  IMPRESSION: No acute cardiopulmonary disease. Electronically Signed   By: Maudry Mayhew M.D.   On: 11/23/2020 23:01     ____________________________________________   PROCEDURES  Procedure(s) performed: None Procedures   Critical Care performed:  None ____________________________________________   INITIAL IMPRESSION / ASSESSMENT AND PLAN / ED COURSE  20 y.o. female no significant past medical history who presents for evaluation of chest pain.  Patient with 1 episode of severe central chest pressure radiating to the back and to her upper abdomen associated with nausea and vomiting after eating pizza.  She is currently extremely well-appearing in no distress with normal vital signs.  Heart regular rate and rhythm, lungs are clear to auscultation, abdomen is soft and nontender.  Differential diagnoses including indigestion/GERD versus esophageal spasm versus peptic ulcer disease versus gallbladder pathology versus pancreatitis versus ACS.  PERC negative.  EKG showing no signs of ischemia.  2 high-sensitivity troponins were negative.  CBC and metabolic panel with no acute  findings.  Chest x-ray visualized by me with no acute findings.  Most likely indigestion.  Will discharge patient home with Tums and Pepcid.  Discussed my standard return precautions and follow-up with primary care doctor.  Old medical records reviewed.      _____________________________________________ Please note:  Patient was evaluated in Emergency Department today for the symptoms described in the history of present illness. Patient was evaluated in the context of the global COVID-19 pandemic, which necessitated consideration that the patient might be at risk for infection with the SARS-CoV-2 virus that causes COVID-19. Institutional protocols and algorithms that pertain to the evaluation of patients at risk for COVID-19 are in a state of rapid change based on information released by regulatory bodies including the CDC and  federal and state organizations. These policies and algorithms were followed during the patient's care in the ED.  Some ED evaluations and interventions may be delayed as a result of limited staffing during the pandemic.    Controlled Substance Database was reviewed by me. ____________________________________________   FINAL CLINICAL IMPRESSION(S) / ED DIAGNOSES   Final diagnoses:  Chest pain, unspecified type  Indigestion      NEW MEDICATIONS STARTED DURING THIS VISIT:  ED Discharge Orders          Ordered    calcium carbonate (TUMS) 500 MG chewable tablet  Daily        11/24/20 0557    famotidine (PEPCID) 20 MG tablet  2 times daily        11/24/20 0557             Note:  This document was prepared using Dragon voice recognition software and may include unintentional dictation errors.    Nita Sickle, MD 11/24/20 [phone]

## 2021-05-12 ENCOUNTER — Ambulatory Visit
Admission: EM | Admit: 2021-05-12 | Discharge: 2021-05-12 | Disposition: A | Discharge status: Home / Self Care | Payer: Medicaid Other | Attending: Internal Medicine | Admitting: Internal Medicine

## 2021-05-12 DIAGNOSIS — R1013 Epigastric pain: Secondary | ICD-10-CM | POA: Diagnosis present

## 2021-05-12 DIAGNOSIS — R748 Abnormal levels of other serum enzymes: Secondary | ICD-10-CM

## 2021-05-12 DIAGNOSIS — R17 Unspecified jaundice: Secondary | ICD-10-CM | POA: Diagnosis present

## 2021-05-12 DIAGNOSIS — K859 Acute pancreatitis without necrosis or infection, unspecified: Secondary | ICD-10-CM | POA: Diagnosis not present

## 2021-05-12 LAB — CBC WITH DIFFERENTIAL/PLATELET
Abs Immature Granulocytes: 0.03 10*3/uL (ref 0.00–0.07)
Basophils Absolute: 0 10*3/uL (ref 0.0–0.1)
Basophils Relative: 0 %
Eosinophils Absolute: 0.1 10*3/uL (ref 0.0–0.5)
Eosinophils Relative: 1 %
HCT: 43.5 % (ref 36.0–46.0)
Hemoglobin: 14.3 g/dL (ref 12.0–15.0)
Immature Granulocytes: 0 %
Lymphocytes Relative: 7 %
Lymphs Abs: 0.7 10*3/uL (ref 0.7–4.0)
MCH: 27.7 pg (ref 26.0–34.0)
MCHC: 32.9 g/dL (ref 30.0–36.0)
MCV: 84.3 fL (ref 80.0–100.0)
Monocytes Absolute: 0.4 10*3/uL (ref 0.1–1.0)
Monocytes Relative: 4 %
Neutro Abs: 8.2 10*3/uL — ABNORMAL HIGH (ref 1.7–7.7)
Neutrophils Relative %: 88 %
Platelets: 259 10*3/uL (ref 150–400)
RBC: 5.16 MIL/uL — ABNORMAL HIGH (ref 3.87–5.11)
RDW: 13.5 % (ref 11.5–15.5)
WBC: 9.4 10*3/uL (ref 4.0–10.5)
nRBC: 0 % (ref 0.0–0.2)

## 2021-05-12 LAB — COMPREHENSIVE METABOLIC PANEL
ALT: 468 U/L — ABNORMAL HIGH (ref 0–44)
AST: 203 U/L — ABNORMAL HIGH (ref 15–41)
Albumin: 4.6 g/dL (ref 3.5–5.0)
Alkaline Phosphatase: 178 U/L — ABNORMAL HIGH (ref 38–126)
Anion gap: 9 (ref 5–15)
BUN: 11 mg/dL (ref 6–20)
CO2: 22 mmol/L (ref 22–32)
Calcium: 9.1 mg/dL (ref 8.9–10.3)
Chloride: 103 mmol/L (ref 98–111)
Creatinine, Ser: 0.71 mg/dL (ref 0.44–1.00)
GFR, Estimated: >60 mL/min (ref >60)
Glucose, Bld: 115 mg/dL — ABNORMAL HIGH (ref 70–99)
Potassium: 4.2 mmol/L (ref 3.5–5.1)
Sodium: 134 mmol/L — ABNORMAL LOW (ref 135–145)
Total Bilirubin: 3.5 mg/dL — ABNORMAL HIGH (ref 0.3–1.2)
Total Protein: 8.1 g/dL (ref 6.5–8.1)

## 2021-05-12 LAB — AMYLASE: Amylase: 4212 U/L — ABNORMAL HIGH (ref 28–100)

## 2021-05-12 LAB — LIPASE, BLOOD: Lipase: 3703 U/L — ABNORMAL HIGH (ref 11–51)

## 2021-05-12 MED ORDER — ONDANSETRON 8 MG PO TBDP
8.0000 mg | ORAL_TABLET | Freq: Once | ORAL | Status: AC
  Administered 2021-05-12: 8 mg via ORAL

## 2021-05-12 MED ORDER — ALUM & MAG HYDROXIDE-SIMETH 200-200-20 MG/5ML PO SUSP
30.0000 mL | Freq: Once | ORAL | Status: AC
  Administered 2021-05-12: 30 mL via ORAL

## 2021-05-12 MED ORDER — LIDOCAINE VISCOUS HCL 2 % MT SOLN
15.0000 mL | Freq: Once | OROMUCOSAL | Status: AC
  Administered 2021-05-12: 15 mL via ORAL

## 2021-05-12 NOTE — ED Triage Notes (Signed)
Used interpreter Paula Compton [phone] ? ?Pt c/o yellowing of her eyes x2days, back pain and pain between breasts x4-5days.  ? ?Pt denies any SOB and states that she just has pressure along her upper back and chest.  ? ?Pt states that she has had indigestion since Friday and believes that to be the pain in her back and stomach. Pt has vomited and last time was on 05/11/21 at 4am.  ?

## 2021-05-12 NOTE — ED Provider Notes (Signed)
?[facility] ? ? ? ?CSN: [id number] ?Arrival date & time: 05/12/21  0915 ? ? ?  ? ?History   ?Chief Complaint ?Chief Complaint  ?Patient presents with  ? Back Pain  ? Eye Problem  ? ? ?HPI ?Diane Turner is a 21 y.o. female who presents  with onset of N/V x 3 days and this day had Wendis and one hour later started with epigastric pain, and later on felt mid thoracic pain. Diarrhea onset last night. Has tried Tums and Mylanta with slight improvement, but symptoms come back. Has vomited 2-3 times a  day and diarrhea started yesterday and went x 3 so far. She was in [place] 20 days ago, but was fine when she came back. Yesterday noticed her scleras turning a little yellow. Her mother who is with pt denies family hx of gallbladder disease. She denies Tylenol or ETOH use.  ? ? ? ? ?Past Medical History:  ?Diagnosis Date  ? Medical history non-contributory   ? ? ?There are no problems to display for this patient. ? ? ?Past Surgical History:  ?Procedure Laterality Date  ? NO PAST SURGERIES    ? ORIF ANKLE FRACTURE Left 03/23/2019  ? Procedure: OPEN REDUCTION INTERNAL FIXATION (ORIF) ANKLE FRACTURE BIMALLEOLAR LEFT;  Surgeon: Rosetta Posner, DPM;  Location: [facility];  Service: Podiatry;  Laterality: Left;  CHOICE WITH PREOP POP/SAPH BLOCK  ? ? ?OB History   ?No obstetric history on file. ?  ? ? ? ?Home Medications   ? ?Prior to Admission medications   ?Medication Sig Start Date End Date Taking? Authorizing Provider  ?calcium carbonate (TUMS) 500 MG chewable tablet Chew 1 tablet (200 mg of elemental calcium total) by mouth daily. 11/24/20 11/24/21 Yes Nita Sickle, MD  ? ? ?Family History ?History reviewed. No pertinent family history. ? ?Social History ?Social History  ? ?Tobacco Use  ? Smoking status: Never  ? Smokeless tobacco: Never  ?Vaping Use  ? Vaping Use: Never used  ?Substance Use Topics  ? Alcohol use: Never  ? Drug use: Never  ? ? ? ?Allergies   ?Patient has no known  allergies. ? ? ?Review of Systems ?Review of Systems  ?Constitutional:  Positive for appetite change. Negative for chills and fever.  ?Eyes:  Negative for pain, discharge and itching.  ?     Yellow eyes  ?Gastrointestinal:  Positive for abdominal pain, diarrhea, nausea and vomiting. Negative for blood in stool.  ?Genitourinary:  Negative for difficulty urinating.  ?Musculoskeletal:  Negative for myalgias.  ?Skin:  Negative for rash.  ?Neurological:  Negative for headaches.  ? ? ?Physical Exam ?Triage Vital Signs ?ED Triage Vitals  ?Enc Vitals Group  ?   BP 05/12/21 0934 (!) 141/120  ?   Pulse Rate 05/12/21 0934 82  ?   Resp 05/12/21 0934 18  ?   Temp 05/12/21 0934 98.2 ?F (36.8 ?C)  ?   Temp Source 05/12/21 0934 Oral  ?   SpO2 05/12/21 0934 100 %  ?   Weight --   ?   Height 05/12/21 0927 5\' 4"  (1.626 m)  ?   Head Circumference --   ?   Peak Flow --   ?   Pain Score 05/12/21 0927 6  ?   Pain Loc --   ?   Pain Edu? --   ?   Excl. in GC? --   ? ?No data found. ? ?Updated Vital Signs ?BP 115/78 (BP Location: Left Arm)  Pulse 82   Temp 98.2 ?F (36.8 ?C) (Oral)   Resp 18   Ht 5\' 4"  (1.626 m)   LMP 05/12/2021   SpO2 100%   BMI 28.32 kg/m?  ? ?Visual Acuity ?Right Eye Distance:   ?Left Eye Distance:   ?Bilateral Distance:   ? ?Right Eye Near:   ?Left Eye Near:    ?Bilateral Near:    ? ?Physical Exam ?Vitals and nursing note reviewed.  ?Constitutional:   ?   General: She is not in acute distress. ?   Appearance: She is not toxic-appearing.  ?HENT:  ?   Head: Normocephalic.  ?   Right Ear: External ear normal.  ?   Left Ear: External ear normal.  ?Eyes:  ?   General: Scleral icterus present.  ?   Conjunctiva/sclera: Conjunctivae normal.  ?   Comments: Has mild icterus  ?Pulmonary:  ?   Effort: Pulmonary effort is normal.  ?Abdominal:  ?   General: Bowel sounds are normal.  ?   Palpations: Abdomen is soft. There is no mass.  ?   Tenderness: There is abdominal tenderness in the epigastric area. There is no guarding.   ?Musculoskeletal:     ?   General: Normal range of motion.  ?   Comments: THORACIC BACK_- does not have any tenderness with palpation, but felt pain with L thoracic rotation.   ?Skin: ?   General: Skin is warm and dry.  ?   Findings: No rash.  ?Neurological:  ?   Mental Status: She is alert and oriented to person, place, and time.  ?   Gait: Gait normal.  ?Psychiatric:     ?   Mood and Affect: Mood normal.     ?   Behavior: Behavior normal.     ?   Thought Content: Thought content normal.     ?   Judgment: Judgment normal.  ? ? ? ?UC Treatments / Results  ?Labs ?(all labs ordered are listed, but only abnormal results are displayed) ?Labs Reviewed  ?CBC WITH DIFFERENTIAL/PLATELET - Abnormal; Notable for the following components:  ?    Result Value  ? RBC 5.16 (*)   ? Neutro Abs 8.2 (*)   ? All other components within normal limits  ?AMYLASE - Abnormal; Notable for the following components:  ? Amylase 4,212 (*)   ? All other components within normal limits  ?COMPREHENSIVE METABOLIC PANEL - Abnormal; Notable for the following components:  ? Sodium 134 (*)   ? Glucose, Bld 115 (*)   ? AST 203 (*)   ? ALT 468 (*)   ? Alkaline Phosphatase 178 (*)   ? Total Bilirubin 3.5 (*)   ? All other components within normal limits  ?LIPASE, BLOOD - Abnormal; Notable for the following components:  ? Lipase 3,703 (*)   ? All other components within normal limits  ?11:35 Amylase is not done, but levels are at 4122 so far ? ?EKG ? ? ?Radiology ?No results found. ? ?Procedures ?Procedures (including critical care time) ? ?Medications Ordered in UC ?Medications  ?alum & mag hydroxide-simeth (MAALOX/MYLANTA) 200-200-20 MG/5ML suspension 30 mL (30 mLs Oral Given 05/12/21 0957)  ?  And  ?lidocaine (XYLOCAINE) 2 % viscous mouth solution 15 mL (15 mLs Oral Given 05/12/21 0957)  ?ondansetron (ZOFRAN-ODT) disintegrating tablet 8 mg (8 mg Oral Given 05/12/21 1049)  ? ? ?Initial Impression / Assessment and Plan / UC Course  ?I have reviewed the  triage vital signs and  the nursing notes. ? ?Pertinent labs  results that were available during my care of the patient were reviewed by me and considered in my medical decision making (see chart for details). ? ? ?She was given GI cocktail and did not help  the epigastric or back pain. Later was given Zofran 8 mg ODT and 30 minutes later was checked and the nausea had improved.  ? ? ?She has pancreatitis, may have an obstruction. ? ?She was sent to ER right now.  ?Final Clinical Impressions(s) / UC Diagnoses  ? ?Final diagnoses:  ?Abdominal pain, epigastric  ?Elevated liver enzymes  ?Icterus  ?Acute pancreatitis, unspecified complication status, unspecified pancreatitis type  ? ? ? ?Discharge Instructions   ? ?  ?Vaya a emergencias hoy para hacerle hacer mas analisis.  ? ? ? ? ?ED Prescriptions   ?None ?  ? ?PDMP not reviewed this encounter. ?  ?Garey HamRodriguez-Southworth, Juanelle Trueheart, PA-C ?05/12/21 1335 ? ?

## 2021-05-12 NOTE — Discharge Instructions (Addendum)
Vaya a emergencias hoy para hacerle hacer mas analisis.  ?

## 2022-01-29 ENCOUNTER — Other Ambulatory Visit: Payer: Self-pay | Admitting: Family Medicine

## 2022-01-29 DIAGNOSIS — R319 Hematuria, unspecified: Secondary | ICD-10-CM

## 2022-02-19 ENCOUNTER — Inpatient Hospital Stay: Admission: RE | Admit: 2022-02-19 | Payer: Medicaid Other | Source: Ambulatory Visit

## 2022-03-03 ENCOUNTER — Ambulatory Visit
Admission: RE | Admit: 2022-03-03 | Discharge: 2022-03-03 | Disposition: A | Discharge status: Home / Self Care | Payer: Medicaid Other | Source: Ambulatory Visit | Attending: Family Medicine | Admitting: Family Medicine

## 2022-03-03 DIAGNOSIS — R319 Hematuria, unspecified: Secondary | ICD-10-CM
